# Patient Record
Sex: Female | Born: 1990 | Race: White | Hispanic: No | Marital: Single | Smoking: Never smoker
Health system: Southern US, Community
[De-identification: ages and names within clinical notes are randomized; demographics above are authoritative.]

## PROBLEM LIST (undated history)

## (undated) DIAGNOSIS — K30 Functional dyspepsia: Secondary | ICD-10-CM

## (undated) DIAGNOSIS — IMO0001 Reserved for inherently not codable concepts without codable children: Secondary | ICD-10-CM

---

## 2015-09-10 ENCOUNTER — Emergency Department (HOSPITAL_COMMUNITY): Payer: 59

## 2015-09-10 ENCOUNTER — Emergency Department (HOSPITAL_COMMUNITY)
Admission: EM | Admit: 2015-09-10 | Discharge: 2015-09-11 | Disposition: A | Discharge status: Home / Self Care | Payer: 59 | Attending: Emergency Medicine | Admitting: Emergency Medicine

## 2015-09-10 ENCOUNTER — Encounter (HOSPITAL_COMMUNITY): Payer: Self-pay | Admitting: Emergency Medicine

## 2015-09-10 DIAGNOSIS — K8020 Calculus of gallbladder without cholecystitis without obstruction: Secondary | ICD-10-CM | POA: Insufficient documentation

## 2015-09-10 DIAGNOSIS — Z3202 Encounter for pregnancy test, result negative: Secondary | ICD-10-CM | POA: Insufficient documentation

## 2015-09-10 DIAGNOSIS — Z79899 Other long term (current) drug therapy: Secondary | ICD-10-CM | POA: Diagnosis not present

## 2015-09-10 DIAGNOSIS — Z880 Allergy status to penicillin: Secondary | ICD-10-CM | POA: Diagnosis not present

## 2015-09-10 DIAGNOSIS — R1013 Epigastric pain: Secondary | ICD-10-CM | POA: Diagnosis present

## 2015-09-10 LAB — CBC
HCT: 39 % (ref 36.0–46.0)
HEMOGLOBIN: 12.3 g/dL (ref 12.0–15.0)
MCH: 26 pg (ref 26.0–34.0)
MCHC: 31.5 g/dL (ref 30.0–36.0)
MCV: 82.5 fL (ref 78.0–100.0)
PLATELETS: 330 10*3/uL (ref 150–400)
RBC: 4.73 MIL/uL (ref 3.87–5.11)
RDW: 15.8 % — AB (ref 11.5–15.5)
WBC: 11.6 10*3/uL — ABNORMAL HIGH (ref 4.0–10.5)

## 2015-09-10 LAB — URINALYSIS, ROUTINE W REFLEX MICROSCOPIC
BILIRUBIN URINE: NEGATIVE
Glucose, UA: NEGATIVE mg/dL
HGB URINE DIPSTICK: NEGATIVE
KETONES UR: NEGATIVE mg/dL
NITRITE: NEGATIVE
PROTEIN: NEGATIVE mg/dL
SPECIFIC GRAVITY, URINE: 1.02 (ref 1.005–1.030)
pH: 7.5 (ref 5.0–8.0)

## 2015-09-10 LAB — COMPREHENSIVE METABOLIC PANEL
ALBUMIN: 4.4 g/dL (ref 3.5–5.0)
ALK PHOS: 45 U/L (ref 38–126)
ALT: 15 U/L (ref 14–54)
ANION GAP: 11 (ref 5–15)
AST: 17 U/L (ref 15–41)
BILIRUBIN TOTAL: 0.5 mg/dL (ref 0.3–1.2)
BUN: 21 mg/dL — ABNORMAL HIGH (ref 6–20)
CALCIUM: 10 mg/dL (ref 8.9–10.3)
CO2: 23 mmol/L (ref 22–32)
CREATININE: 0.86 mg/dL (ref 0.44–1.00)
Chloride: 107 mmol/L (ref 101–111)
GFR calc Af Amer: >60 mL/min (ref >60)
GFR calc non Af Amer: >60 mL/min (ref >60)
GLUCOSE: 116 mg/dL — AB (ref 65–99)
Potassium: 4.1 mmol/L (ref 3.5–5.1)
Sodium: 141 mmol/L (ref 135–145)
TOTAL PROTEIN: 8 g/dL (ref 6.5–8.1)

## 2015-09-10 LAB — URINE MICROSCOPIC-ADD ON: RBC / HPF: NONE SEEN RBC/hpf (ref 0–5)

## 2015-09-10 LAB — PREGNANCY, URINE: Preg Test, Ur: NEGATIVE

## 2015-09-10 LAB — LIPASE, BLOOD: Lipase: 28 U/L (ref 11–51)

## 2015-09-10 MED ORDER — MORPHINE SULFATE (PF) 4 MG/ML IV SOLN
4.0000 mg | Freq: Once | INTRAVENOUS | Status: AC
  Administered 2015-09-11: 4 mg via INTRAVENOUS
  Filled 2015-09-10: qty 1

## 2015-09-10 MED ORDER — GI COCKTAIL ~~LOC~~
30.0000 mL | Freq: Once | ORAL | Status: AC
  Administered 2015-09-10: 30 mL via ORAL
  Filled 2015-09-10: qty 30

## 2015-09-10 MED ORDER — ONDANSETRON HCL 4 MG/2ML IJ SOLN
4.0000 mg | INTRAMUSCULAR | Status: AC
  Administered 2015-09-10: 4 mg via INTRAVENOUS
  Filled 2015-09-10: qty 2

## 2015-09-10 MED ORDER — ONDANSETRON 4 MG PO TBDP
4.0000 mg | ORAL_TABLET | Freq: Once | ORAL | Status: AC
  Administered 2015-09-10: 4 mg via ORAL
  Filled 2015-09-10: qty 1

## 2015-09-10 MED ORDER — MORPHINE SULFATE (PF) 4 MG/ML IV SOLN
4.0000 mg | Freq: Once | INTRAVENOUS | Status: AC
  Administered 2015-09-10: 4 mg via INTRAVENOUS
  Filled 2015-09-10: qty 1

## 2015-09-10 NOTE — ED Notes (Signed)
Pt requesting additional medication for pain, PA aware.

## 2015-09-10 NOTE — ED Notes (Signed)
Spoke with US tech, states that she is en route.

## 2015-09-10 NOTE — ED Notes (Signed)
Pt arrived to the Ed with a complaint of abdominal pain.  Pt's pain is located mid medial quadrant of the abdomen.  Pain is described as achy, burning and discomforting.  Pt had an episode of emesis aan hour ago.  Pt states that pain started Tuesday.

## 2015-09-10 NOTE — ED Notes (Signed)
x2 unsuccessful IV attempts. Another RN to attempt.  

## 2015-09-10 NOTE — ED Provider Notes (Signed)
CSN: [id number]     Arrival date & time 09/10/15  2108 History   First MD Initiated Contact with Patient 09/10/15 2123     Chief Complaint  Patient presents with  . Abdominal Pain    HPI  Leslie Mitchell is a 24 y.o. female with no pertinent PMH who presents to the ED with epigastric abdominal pain. She reports her symptoms started on Tuesday, and initially resolved, however recurred today prior to arrival. She states her pain is constant and characterizes her symptoms as burning. She reports sitting exacerbates her pain. She has tried pepto and [employer] seltzer for symptom relief. She reports associated nausea and one episode of emesis. She denies hematemesis. She denies fever, chills, chest pain, shortness of breath, diarrhea, constipation, hematochezia, melena, dysuria, urgency, frequency, vaginal discharge.   History reviewed. No pertinent past medical history. History reviewed. No pertinent past surgical history. History reviewed. No pertinent family history. Social History  Substance Use Topics  . Smoking status: Never Smoker   . Smokeless tobacco: Never Used  . Alcohol Use: Yes   OB History    No data available      Review of Systems  Constitutional: Negative for fever and chills.  Respiratory: Negative for shortness of breath.   Cardiovascular: Negative for chest pain.  Gastrointestinal: Positive for nausea, vomiting and abdominal pain. Negative for diarrhea, constipation and blood in stool.  Genitourinary: Negative for dysuria, urgency, frequency and vaginal discharge.  All other systems reviewed and are negative.     Allergies  Penicillins  Home Medications   Prior to Admission medications   Medication Sig Start Date End Date Taking? Authorizing Provider  fexofenadine (ALLEGRA) 30 MG tablet Take 30 mg by mouth daily.   Yes Historical Provider, MD  HYDROcodone-acetaminophen (NORCO/VICODIN) 5-325 MG tablet Take 2 tablets by mouth every 4 (four) hours as needed.  09/11/15   Mady GemmaElizabeth C Westfall, PA-C  ibuprofen (ADVIL,MOTRIN) 200 MG tablet Take 200 mg by mouth every 6 (six) hours as needed for mild pain or moderate pain.   Yes Historical Provider, MD  ondansetron (ZOFRAN ODT) 4 MG disintegrating tablet Take 1 tablet (4 mg total) by mouth every 8 (eight) hours as needed for nausea. 09/11/15   Mady GemmaElizabeth C Westfall, PA-C    BP 137/110 mmHg  Pulse 87  Temp(Src) 98.3 F (36.8 C) (Oral)  Resp 18  SpO2 98%  LMP 08/18/2015 (Approximate) Physical Exam  Constitutional: She is oriented to person, place, and time. She appears well-developed and well-nourished. No distress.  HENT:  Head: Normocephalic and atraumatic.  Right Ear: External ear normal.  Left Ear: External ear normal.  Nose: Nose normal.  Mouth/Throat: Uvula is midline, oropharynx is clear and moist and mucous membranes are normal.  Eyes: Conjunctivae, EOM and lids are normal. Pupils are equal, round, and reactive to light. Right eye exhibits no discharge. Left eye exhibits no discharge. No scleral icterus.  Neck: Normal range of motion. Neck supple.  Cardiovascular: Normal rate, regular rhythm, normal heart sounds, intact distal pulses and normal pulses.   Pulmonary/Chest: Effort normal and breath sounds normal. No respiratory distress. She has no wheezes. She has no rales.  Abdominal: Soft. Normal appearance and bowel sounds are normal. She exhibits no distension and no mass. There is no tenderness. There is no rigidity, no rebound and no guarding.  No significant TTP of abdomen, though patient reports pressure sensation with palpation.  Musculoskeletal: Normal range of motion. She exhibits no edema or tenderness.  Neurological: She  is alert and oriented to person, place, and time.  Skin: Skin is warm, dry and intact. No rash noted. She is not diaphoretic. No erythema. No pallor.  Psychiatric: She has a normal mood and affect. Her speech is normal and behavior is normal.  Nursing note and  vitals reviewed.   ED Course  Procedures (including critical care time)  Labs Review Labs Reviewed  COMPREHENSIVE METABOLIC PANEL - Abnormal; Notable for the following:    Glucose, Bld 116 (*)    BUN 21 (*)    All other components within normal limits  CBC - Abnormal; Notable for the following:    WBC 11.6 (*)    RDW 15.8 (*)    All other components within normal limits  URINALYSIS, ROUTINE W REFLEX MICROSCOPIC (NOT AT [facility]) - Abnormal; Notable for the following:    APPearance TURBID (*)    Leukocytes, UA SMALL (*)    All other components within normal limits  URINE MICROSCOPIC-ADD ON - Abnormal; Notable for the following:    Squamous Epithelial / LPF 0-5 (*)    Bacteria, UA RARE (*)    All other components within normal limits  LIPASE, BLOOD  PREGNANCY, URINE    Imaging Review US Abdomen Limited  09/11/2015  CLINICAL DATA:  Right upper quadrant pain since Tuesday. EXAM: US ABDOMEN LIMITED - RIGHT UPPER QUADRANT COMPARISON:  None. FINDINGS: Gallbladder: Stone in the dependent gallbladder with mild sludge layering. Stone measures about 8 mm maximal diameter. No gallbladder wall thickening. Murphy's sign is negative. Common bile duct: Diameter: 5.3 mm, normal Liver: No focal lesion identified. Within normal limits in parenchymal echogenicity. IMPRESSION: Cholelithiasis with a single stone and mild sludge layering. No secondary signs to suggest cholecystitis. Electronically Signed   By: Burman Nieves M.D.   On: 09/11/2015 00:18     I have personally reviewed and evaluated these images and lab results as part of my medical decision-making.   EKG Interpretation None      MDM   Final diagnoses:  Calculus of gallbladder without cholecystitis without obstruction    24 year old female presents with epigastric abdominal pain. She reports she experienced the same symptoms on Tuesday. She states her pain initially resolved, however occurred prior to arrival today. She reports  associated nausea and one episode of emesis. She denies fever, chills, chest pain, shortness of breath, hematemesis, diarrhea, constipation, hematochezia, melena, dysuria, urgency, frequency, vaginal discharge.  Patient is afebrile. Vital signs stable. Heart regular rate and rhythm. Lungs clear to auscultation bilaterally. Abdomen soft, nontender, nondistended. Patient describes pressure sensation on palpation of upper abdominal quadrants, however denies pain.  Given zofran for nausea and GI cocktail for pain. Labs as above pending. Patient denies pain improvement, given pain medication.  CBC remarkable for mild leukocytosis of 11.6. CMP unremarkable. Lipase within normal limits. UA negative for infection. Urine pregnancy negative.  Will obtain RUQ [place]. Imaging remarkable for cholelithiasis with a single stone and mild sludge layering, no secondary signs to suggest cholecystitis. No gallbladder wall thickening. Murphy's sign negative.  Patient is nontoxic, feel she is stable for discharge at this time. Will discharge with short course of pain medication and antiemetic for symptom management. Patient to follow-up with general surgery for further evaluation and management. Return precautions discussed. Patient verbalizes her understanding and is in agreement with plan.  BP 137/110 mmHg  Pulse 87  Temp(Src) 98.3 F (36.8 C) (Oral)  Resp 18  SpO2 98%  LMP 08/18/2015 (Approximate)   Mady Gemma,  PA-C 09/11/15 1610  Lavera Guise, MD 09/11/15 1434

## 2015-09-10 NOTE — ED Notes (Signed)
Pt actively vomiting.  PA aware.

## 2015-09-11 DIAGNOSIS — K8020 Calculus of gallbladder without cholecystitis without obstruction: Secondary | ICD-10-CM | POA: Diagnosis not present

## 2015-09-11 MED ORDER — HYDROCODONE-ACETAMINOPHEN 5-325 MG PO TABS: 2.0000 | ORAL_TABLET | ORAL | Status: DC | PRN

## 2015-09-11 MED ORDER — ONDANSETRON 4 MG PO TBDP: 4.0000 mg | ORAL_TABLET | Freq: Three times a day (TID) | ORAL | Status: DC | PRN

## 2015-09-11 NOTE — ED Notes (Signed)
Pt able to ambulate to BR at this time with stand by assist only.

## 2015-09-11 NOTE — Discharge Instructions (Signed)
1. Medications: pain medication, zofran, usual home medications 2. Treatment: rest, drink plenty of fluids 3. Follow Up: please followup with general surgery for discussion of your diagnoses and further evaluation after today's visit; please return to the ER for high fever, severe pain, persistent vomiting, new or worsening symptoms   Biliary Colic Biliary colic is a pain in the upper abdomen. The pain:  Is usually felt on the right side of the abdomen, but it may also be felt in the center of the abdomen, just below the breastbone (sternum).  May spread back toward the right shoulder blade.  May be steady or irregular.  May be accompanied by nausea and vomiting. Most of the time, the pain goes away in 1-5 hours. After the most intense pain passes, the abdomen may continue to ache mildly for about 24 hours. Biliary colic is caused by a blockage in the bile duct. The bile duct is a pathway that carries bile--a liquid that helps to digest fats--from the gallbladder to the small intestine. Biliary colic usually occurs after eating, when the digestive system demands bile. The pain develops when muscle cells contract forcefully to try to move the blockage so that bile can get by. HOME CARE INSTRUCTIONS  Take medicines only as directed by your health care provider.  Drink enough fluid to keep your urine clear or pale yellow.  Avoid fatty, greasy, and fried foods. These kinds of foods increase your body's demand for bile.  Avoid any foods that make your pain worse.  Avoid overeating.  Avoid having a large meal after fasting. SEEK MEDICAL CARE IF:  You develop a fever.  Your pain gets worse.  You vomit.  You develop nausea that prevents you from eating and drinking. SEEK IMMEDIATE MEDICAL CARE IF:  You suddenly develop a fever and shaking chills.  You develop a yellowish discoloration (jaundice) of:  Skin.  Whites of the eyes.  Mucous membranes.  You have continuous or  severe pain that is not relieved with medicines.  You have nausea and vomiting that is not relieved with medicines.  You develop dizziness or you faint.   This information is not intended to replace advice given to you by your health care provider. Make sure you discuss any questions you have with your health care provider.   Document Released: 03/06/2006 Document Revised: 02/17/2015 Document Reviewed: 07/15/2014 Elsevier Interactive Patient Education 2016 [employer].  Cholelithiasis Cholelithiasis (also called gallstones) is a form of gallbladder disease in which gallstones form in your gallbladder. The gallbladder is an organ that stores bile made in the liver, which helps digest fats. Gallstones begin as small crystals and slowly grow into stones. Gallstone pain occurs when the gallbladder spasms and a gallstone is blocking the duct. Pain can also occur when a stone passes out of the duct.  RISK FACTORS  Being female.   Having multiple pregnancies. Health care providers sometimes advise removing diseased gallbladders before future pregnancies.   Being obese.  Eating a diet heavy in fried foods and fat.   Being older than 60 years and increasing age.   Prolonged use of medicines containing female hormones.   Having diabetes mellitus.   Rapidly losing weight.   Having a family history of gallstones (heredity).  SYMPTOMS  Nausea.   Vomiting.  Abdominal pain.   Yellowing of the skin (jaundice).   Sudden pain. It may persist from several minutes to several hours.  Fever.   Tenderness to the touch. In some cases, when  gallstones do not move into the bile duct, people have no pain or symptoms. These are called "silent" gallstones.  TREATMENT Silent gallstones do not need treatment. In severe cases, emergency surgery may be required. Options for treatment include:  Surgery to remove the gallbladder. This is the most common treatment.  Medicines. These  do not always work and may take 6-12 months or more to work.  Shock wave treatment (extracorporeal biliary lithotripsy). In this treatment an ultrasound machine sends shock waves to the gallbladder to break gallstones into smaller pieces that can pass into the intestines or be dissolved by medicine. HOME CARE INSTRUCTIONS   Only take over-the-counter or prescription medicines for pain, discomfort, or fever as directed by your health care provider.   Follow a low-fat diet until seen again by your health care provider. Fat causes the gallbladder to contract, which can result in pain.   Follow up with your health care provider as directed. Attacks are almost always recurrent and surgery is usually required for permanent treatment.  SEEK IMMEDIATE MEDICAL CARE IF:   Your pain increases and is not controlled by medicines.   You have a fever or persistent symptoms for more than 2-3 days.   You have a fever and your symptoms suddenly get worse.   You have persistent nausea and vomiting.  MAKE SURE YOU:   Understand these instructions.  Will watch your condition.  Will get help right away if you are not doing well or get worse.   This information is not intended to replace advice given to you by your health care provider. Make sure you discuss any questions you have with your health care provider.   Document Released: 09/29/2005 Document Revised: 06/05/2013 Document Reviewed: 03/27/2013 Elsevier Interactive Patient Education [employer]2016 [employer]

## 2015-09-15 ENCOUNTER — Other Ambulatory Visit: Payer: Self-pay | Admitting: Surgery

## 2015-09-15 NOTE — H&P (Signed)
Leslie Mitchell 09/15/2015 11:17 AM Location: [facility] Patient #: [id number] DOB: [birth year] Single / Language: Leslie Mitchell / Race: White Female  History of Present Illness Leslie Hector MD; 09/15/2015 11:46 AM) The patient is a 24 year old female who presents for evaluation of gall stones. Patient sent from the emergency room Leslie Chimes, PA-C & Leslie Mitchell, [place] ED) after visit on Thanksgiving 11/24. Concern for symptomatic gallstones.  Pleasant morbidly obese female with attacks of epigastric abdominal pain radiating to RIGHT side. Happen last Tuesday, 6 days ago, after eating lunch. Then happened again on Thanksgiving. She is morbidly obese. She has intentionally lost about 90 pounds over the past year with a more strict dieting regimen. She does confess that around Thanksgiving she underwent off the wagon. The second attack did she try and use milked calm things down upon the recommendation of a friend that is a nurse who thought maybe it was reflux. That backfired and she had severe vomiting, thus triggering going to the emergency room. Workup ruled out other etiologies. Ultrasound did show gallbladder with sludge and an 8 mm solitary stone. She felt better with pain and nausea medicines. No and has been sick around her. No recent travel history. No history of Crohn's or irritable bowel. No inflammatory bowel disease. No family history of data tract disorders. She rarely drinks alcohol. No history of hepatitis or pancreatitis. She had gotten no relief on these attacks with antacid medications. She denies much in way of heartburn or reflux.   She normally can walk an hour a day without much difficulty. No prior abdominal surgeries. Again weight was 391. Now around 300.        Results for Leslie Mitchell (MRN [id number]) as of 09/15/2015 11:25 Ref. Range 09/10/2015 21:31 09/10/2015 21:36 Sodium Latest Ref Range: 135-145 mmol/L 141 Potassium  Latest Ref Range: 3.5-5.1 mmol/L 4.1 Chloride Latest Ref Range: 101-111 mmol/L 107 CO2 Latest Ref Range: 22-32 mmol/L 23 BUN Latest Ref Range: 6-20 mg/dL 21 (H) Creatinine Latest Ref Range: 0.44-1.00 mg/dL 0.86 Calcium Latest Ref Range: 8.9-10.3 mg/dL 10.0 EGFR (Non-African Amer.) Latest Ref Range: >60 mL/min >60 EGFR (African American) Latest Ref Range: >60 mL/min >60 Glucose Latest Ref Range: 65-99 mg/dL 116 (H) Anion gap Latest Ref Range: 5-15 11 Alkaline Phosphatase Latest Ref Range: 38-126 U/L 45 Albumin Latest Ref Range: 3.5-5.0 g/dL 4.4 Lipase Latest Ref Range: 11-51 U/L 28 AST Latest Ref Range: 15-41 U/L 17 ALT Latest Ref Range: 14-54 U/L 15 Total Protein Latest Ref Range: 6.5-8.1 g/dL 8.0 Total Bilirubin Latest Ref Range: 0.3-1.2 mg/dL 0.5 WBC Latest Ref Range: 4.0-10.5 K/uL 11.6 (H) RBC Latest Ref Range: 3.87-5.11 MIL/uL 4.73 Hemoglobin Latest Ref Range: 12.0-15.0 g/dL 12.3 HCT Latest Ref Range: 36.0-46.0 % 39.0 MCV Latest Ref Range: 78.0-100.0 fL 82.5 MCH Latest Ref Range: 26.0-34.0 pg 26.0 MCHC Latest Ref Range: 30.0-36.0 g/dL 31.5 RDW Latest Ref Range: 11.5-15.5 % 15.8 (H) Platelets Latest Ref Range: 150-400 K/uL 330 Preg Test, Ur Latest Ref Range: NEGATIVE NEGATIVE Appearance Latest Ref Range: CLEAR TURBID (A) Bacteria, UA Latest Ref Range: NONE SEEN RARE (A) Bilirubin Urine Latest Ref Range: NEGATIVE NEGATIVE Color, Urine Latest Ref Range: YELLOW YELLOW Glucose Latest Ref Range: NEGATIVE mg/dL NEGATIVE Hgb urine dipstick Latest Ref Range: NEGATIVE NEGATIVE Ketones, ur Latest Ref Range: NEGATIVE mg/dL NEGATIVE Leukocytes, UA Latest Ref Range: NEGATIVE SMALL (A) Nitrite Latest Ref Range: NEGATIVE NEGATIVE pH Latest Ref Range: 5.0-8.0 7.5 Protein Latest Ref Range: NEGATIVE mg/dL NEGATIVE RBC /  HPF Latest Ref Range: 0-5 RBC/hpf NONE SEEN Specific Gravity, Urine Latest Ref Range: 1.005-1.030 1.020 Squamous Epithelial / LPF Latest Ref Range: NONE SEEN 0-5  (A) Urine-Other Unknown AMORPHOUS URATES/... WBC, UA Latest Ref Range: 0-5 WBC/hpf 0-5      CLINICAL DATA: Right upper quadrant pain since Tuesday.  EXAM: US ABDOMEN LIMITED - RIGHT UPPER QUADRANT  COMPARISON: None.  FINDINGS: Gallbladder:  Stone in the dependent gallbladder with mild sludge layering. Stone measures about 8 mm maximal diameter. No gallbladder wall thickening. Murphy's sign is negative.  Common bile duct:  Diameter: 5.3 mm, normal  Liver:  No focal lesion identified. Within normal limits in parenchymal echogenicity.  IMPRESSION: Cholelithiasis with a single stone and mild sludge layering. No secondary signs to suggest cholecystitis.   Electronically Signed By: Leslie Mitchell M.D. On: 09/11/2015 00:18      Problem List/Past Medical Leslie Hector, MD; 09/15/2015 11:48 AM) MORBID OBESITY, UNSPECIFIED OBESITY TYPE (E66.01)  Other Problems Leslie Hector, MD; 09/15/2015 11:48 AM) Anxiety Disorder Asthma Back Pain Cholelithiasis Gastroesophageal Reflux Disease High blood pressure  Past Surgical History Leslie Mitchell, CMA; 09/15/2015 11:17 AM) No pertinent past surgical history  Diagnostic Studies History Leslie Mitchell, CMA; 09/15/2015 11:17 AM) Colonoscopy never Mammogram never Pap Smear >5 years ago  Allergies Leslie Mitchell, CMA; 09/15/2015 11:18 AM) Penicillin VK *PENICILLINS* Hives.  Medication History Leslie Mitchell, CMA; 09/15/2015 11:18 AM) Ibuprofen (800MG Tablet, Oral) Active. Ondansetron (4MG Tablet Disperse, Oral) Active. Allegra (30MG Tablet, Oral) Active. Medications Reconciled  Pregnancy / Birth History Leslie Mitchell, CMA; 09/15/2015 11:17 AM) Age at menarche 42 years. Gravida 0 Para 0 Regular periods     Review of Systems Leslie Mitchell CMA; 09/15/2015 11:17 AM) General Present- Fatigue. Not Present- Appetite Loss, Chills, Fever, Night Sweats, Weight Gain and Weight Loss. Skin Present-  Ulcer. Not Present- Change in Wart/Mole, Dryness, Hives, Jaundice, New Lesions, Non-Healing Wounds and Rash. HEENT Present- Seasonal Allergies and Wears glasses/contact lenses. Not Present- Earache, Hearing Loss, Hoarseness, Nose Bleed, Oral Ulcers, Ringing in the Ears, Sinus Pain, Sore Throat, Visual Disturbances and Yellow Eyes. Respiratory Present- Snoring. Not Present- Bloody sputum, Chronic Cough, Difficulty Breathing and Wheezing. Gastrointestinal Present- Change in Bowel Habits. Not Present- Abdominal Pain, Bloating, Bloody Stool, Chronic diarrhea, Constipation, Difficulty Swallowing, Excessive gas, Gets full quickly at meals, Hemorrhoids, Indigestion, Nausea, Rectal Pain and Vomiting. Psychiatric Present- Anxiety and Frequent crying. Not Present- Bipolar, Change in Sleep Pattern, Depression and Fearful.  Vitals Leslie Mitchell CMA; 09/15/2015 11:18 AM) 09/15/2015 11:18 AM Weight: 306.6 lb Height: 63in Body Surface Area: 2.32 m Body Mass Index: 54.31 kg/m  Temp.: 13F(Temporal)  Pulse: 84 (Regular)  BP: 120/82 (Sitting, Left Arm, Standard)      Physical Exam Leslie Hector MD; 09/15/2015 11:47 AM)  General Mental Status-Alert. General Appearance-Not in acute distress, Not Sickly. Orientation-Oriented X3. Hydration-Well hydrated. Voice-Normal.  Integumentary Global Assessment Upon inspection and palpation of skin surfaces of the - Axillae: non-tender, no inflammation or ulceration, no drainage. and Distribution of scalp and body hair is normal. General Characteristics Temperature - normal warmth is noted.  Head and Neck Head-normocephalic, atraumatic with no lesions or palpable masses. Face Global Assessment - atraumatic, no absence of expression. Neck Global Assessment - no abnormal movements, no bruit auscultated on the right, no bruit auscultated on the left, no decreased range of motion, non-tender. Trachea-midline. Thyroid Gland  Characteristics - non-tender.  Eye Eyeball - Left-Extraocular movements intact, No Nystagmus. Eyeball - Right-Extraocular movements intact, No Nystagmus. Cornea -  Left-No Hazy. Cornea - Right-No Hazy. Sclera/Conjunctiva - Left-No scleral icterus, No Discharge. Sclera/Conjunctiva - Right-No scleral icterus, No Discharge. Pupil - Left-Direct reaction to light normal. Pupil - Right-Direct reaction to light normal.  ENMT Ears Pinna - Left - no drainage observed, no generalized tenderness observed. Right - no drainage observed, no generalized tenderness observed. Nose and Sinuses External Inspection of the Nose - no destructive lesion observed. Inspection of the nares - Left - quiet respiration. Right - quiet respiration. Mouth and Throat Lips - Upper Lip - no fissures observed, no pallor noted. Lower Lip - no fissures observed, no pallor noted. Nasopharynx - no discharge present. Oral Cavity/Oropharynx - Tongue - no dryness observed. Oral Mucosa - no cyanosis observed. Hypopharynx - no evidence of airway distress observed.  Chest and Lung Exam Inspection Movements - Normal and Symmetrical. Accessory muscles - No use of accessory muscles in breathing. Palpation Palpation of the chest reveals - Non-tender. Auscultation Breath sounds - Normal and Clear.  Cardiovascular Auscultation Rhythm - Regular. Murmurs & Other Heart Sounds - Auscultation of the heart reveals - No Murmurs and No Systolic Clicks.  Abdomen Inspection Inspection of the abdomen reveals - No Visible peristalsis and No Abnormal pulsations. Umbilicus - No Bleeding, No Urine drainage. Palpation/Percussion Palpation and Percussion of the abdomen reveal - Soft, Non Tender, No Rebound tenderness, No Rigidity (guarding) and No Cutaneous hyperesthesia. Note: Morbid obese but soft. Mild epigastric and RIGHT upper discomfort but no Murphy sign. Moderate sized panniculus with some mild maceration but hygiene  otherwise good.  Female Genitourinary Sexual Maturity Tanner 5 - Adult hair pattern. Note: No vaginal bleeding nor discharge. No inguinal hernias. No rash under panniculus. She claims she has a history of hydradenitis. I did not see any significant inflammation site from some mild skin maceration  Peripheral Vascular Upper Extremity Inspection - Left - No Cyanotic nailbeds, Not Ischemic. Right - No Cyanotic nailbeds, Not Ischemic.  Neurologic Neurologic evaluation reveals -normal attention span and ability to concentrate, able to name objects and repeat phrases. Appropriate fund of knowledge , normal sensation and normal coordination. Mental Status Affect - not angry, not paranoid. Cranial Nerves-Normal Bilaterally. Gait-Normal.  Neuropsychiatric Mental status exam performed with findings of-able to articulate well with normal speech/language, rate, volume and coherence, thought content normal with ability to perform basic computations and apply abstract reasoning and no evidence of hallucinations, delusions, obsessions or homicidal/suicidal ideation.  Musculoskeletal Global Assessment Spine, Ribs and Pelvis - no instability, subluxation or laxity. Right Upper Extremity - no instability, subluxation or laxity.  Lymphatic Head & Neck  General Head & Neck Lymphatics: Bilateral - Description - No Localized lymphadenopathy. Axillary  General Axillary Region: Bilateral - Description - No Localized lymphadenopathy. Femoral & Inguinal  Generalized Femoral & Inguinal Lymphatics: Left - Description - No Localized lymphadenopathy. Right - Description - No Localized lymphadenopathy.    Assessment & Plan Leslie Hector MD; 09/15/2015 11:44 AM)  CHRONIC CHOLECYSTITIS WITH CALCULUS (K80.10) Impression: Classic story of biliary colic with severe attack most recently in Thanksgiving. Rest of the differential diagnosis seems unlikely.  I think she would benefit from  cholecystectomy. Especially in light of the intense attacks and young age, risk of further problems is very high. She probably cannot wait a few months. She was concerned about the finances so we'll have our scheduler work with her to make sure we can do it safely and yet not overly strain her financially.  Current Plans You are being scheduled for surgery -  Our schedulers will call you.  You should hear from our office's scheduling department within 5 working days about the location, date, and time of surgery. We try to make accommodations for patient's preferences in scheduling surgery, but sometimes the OR schedule or the surgeon's schedule prevents [place] from making those accommodations.  If you have not heard from our office [phone]) in 5 working days, call the office and ask for your surgeon's nurse.  If you have other questions about your diagnosis, plan, or surgery, call the office and ask for your surgeon's nurse.  Pt Education - Pamphlet Given - Laparoscopic Gallbladder Surgery: discussed with patient and provided information. Written instructions provided The anatomy & physiology of hepatobiliary & pancreatic function was discussed. The pathophysiology of gallbladder dysfunction was discussed. Natural history risks without surgery was discussed. I feel the risks of no intervention will lead to serious problems that outweigh the operative risks; therefore, I recommended cholecystectomy to remove the pathology. I explained laparoscopic techniques with possible need for an open approach. Probable cholangiogram to evaluate the bilary tract was explained as well.  Risks such as bleeding, infection, abscess, leak, injury to other organs, need for further treatment, heart attack, death, and other risks were discussed. I noted a good likelihood this will help address the problem. Possibility that this will not correct all abdominal symptoms was explained. Goals of post-operative recovery were  discussed as well. We will work to minimize complications. An educational handout further explaining the pathology and treatment options was given as well. Questions were answered. The patient expresses understanding & wishes to proceed with surgery.  Pt Education - [facility] Laparosopic Post Op HCI (Zalyn Amend) Pt Education - [facility] Good Bowel Health ([facility]) Pt Education - Laparoscopic Cholecystectomy: gallbladder  Leslie Mitchell, M.D., F.A.C.S. Gastrointestinal and Minimally Invasive Surgery [facility] [address], [place], [place] [address] [phone] Main / Paging

## 2015-10-07 ENCOUNTER — Encounter (HOSPITAL_COMMUNITY): Payer: Self-pay

## 2015-10-07 ENCOUNTER — Encounter (HOSPITAL_COMMUNITY): Payer: Self-pay | Admitting: Emergency Medicine

## 2015-10-07 ENCOUNTER — Emergency Department (HOSPITAL_COMMUNITY)
Admission: EM | Admit: 2015-10-07 | Discharge: 2015-10-07 | Disposition: A | Discharge status: Home / Self Care | Payer: 59 | Attending: Emergency Medicine | Admitting: Emergency Medicine

## 2015-10-07 ENCOUNTER — Encounter (HOSPITAL_COMMUNITY)
Admission: RE | Admit: 2015-10-07 | Discharge: 2015-10-07 | Disposition: A | Discharge status: Home / Self Care | Payer: 59 | Source: Ambulatory Visit | Attending: Surgery | Admitting: Surgery

## 2015-10-07 DIAGNOSIS — Z793 Long term (current) use of hormonal contraceptives: Secondary | ICD-10-CM | POA: Diagnosis not present

## 2015-10-07 DIAGNOSIS — K8050 Calculus of bile duct without cholangitis or cholecystitis without obstruction: Secondary | ICD-10-CM

## 2015-10-07 DIAGNOSIS — K219 Gastro-esophageal reflux disease without esophagitis: Secondary | ICD-10-CM | POA: Diagnosis not present

## 2015-10-07 DIAGNOSIS — K8010 Calculus of gallbladder with chronic cholecystitis without obstruction: Secondary | ICD-10-CM | POA: Diagnosis present

## 2015-10-07 DIAGNOSIS — Z880 Allergy status to penicillin: Secondary | ICD-10-CM | POA: Insufficient documentation

## 2015-10-07 DIAGNOSIS — Z6841 Body Mass Index (BMI) 40.0 and over, adult: Secondary | ICD-10-CM | POA: Diagnosis not present

## 2015-10-07 DIAGNOSIS — J45909 Unspecified asthma, uncomplicated: Secondary | ICD-10-CM | POA: Diagnosis not present

## 2015-10-07 DIAGNOSIS — K7581 Nonalcoholic steatohepatitis (NASH): Secondary | ICD-10-CM | POA: Diagnosis not present

## 2015-10-07 DIAGNOSIS — R1011 Right upper quadrant pain: Secondary | ICD-10-CM | POA: Diagnosis present

## 2015-10-07 HISTORY — DX: Reserved for inherently not codable concepts without codable children: IMO0001

## 2015-10-07 HISTORY — DX: Functional dyspepsia: K30

## 2015-10-07 LAB — CBC
HCT: 37.7 % (ref 36.0–46.0)
Hemoglobin: 11.6 g/dL — ABNORMAL LOW (ref 12.0–15.0)
MCH: 25.9 pg — AB (ref 26.0–34.0)
MCHC: 30.8 g/dL (ref 30.0–36.0)
MCV: 84.2 fL (ref 78.0–100.0)
PLATELETS: 272 10*3/uL (ref 150–400)
RBC: 4.48 MIL/uL (ref 3.87–5.11)
RDW: 16.4 % — AB (ref 11.5–15.5)
WBC: 8.4 10*3/uL (ref 4.0–10.5)

## 2015-10-07 LAB — URINALYSIS, ROUTINE W REFLEX MICROSCOPIC
BILIRUBIN URINE: NEGATIVE
GLUCOSE, UA: NEGATIVE mg/dL
HGB URINE DIPSTICK: NEGATIVE
Ketones, ur: NEGATIVE mg/dL
Leukocytes, UA: NEGATIVE
Nitrite: NEGATIVE
PROTEIN: NEGATIVE mg/dL
Specific Gravity, Urine: 1.022 (ref 1.005–1.030)
pH: 5.5 (ref 5.0–8.0)

## 2015-10-07 LAB — LIPASE, BLOOD: Lipase: 28 U/L (ref 11–51)

## 2015-10-07 LAB — COMPREHENSIVE METABOLIC PANEL
ALK PHOS: 52 U/L (ref 38–126)
ALT: 31 U/L (ref 14–54)
AST: 19 U/L (ref 15–41)
Albumin: 4.2 g/dL (ref 3.5–5.0)
Anion gap: 8 (ref 5–15)
BUN: 13 mg/dL (ref 6–20)
CALCIUM: 9.3 mg/dL (ref 8.9–10.3)
CO2: 25 mmol/L (ref 22–32)
CREATININE: 0.76 mg/dL (ref 0.44–1.00)
Chloride: 106 mmol/L (ref 101–111)
GFR calc non Af Amer: >60 mL/min (ref >60)
GLUCOSE: 103 mg/dL — AB (ref 65–99)
Potassium: 4.1 mmol/L (ref 3.5–5.1)
SODIUM: 139 mmol/L (ref 135–145)
Total Bilirubin: 0.5 mg/dL (ref 0.3–1.2)
Total Protein: 7.8 g/dL (ref 6.5–8.1)

## 2015-10-07 LAB — PREGNANCY, URINE: PREG TEST UR: NEGATIVE

## 2015-10-07 MED ORDER — PERCOCET 5-325 MG PO TABS: 1.0000 | ORAL_TABLET | Freq: Four times a day (QID) | ORAL | Status: DC | PRN

## 2015-10-07 MED ORDER — PROMETHAZINE HCL 25 MG/ML IJ SOLN
25.0000 mg | Freq: Once | INTRAMUSCULAR | Status: AC
  Administered 2015-10-07: 25 mg via INTRAVENOUS
  Filled 2015-10-07: qty 1

## 2015-10-07 MED ORDER — GENTAMICIN SULFATE 40 MG/ML IJ SOLN
5.0000 mg/kg | INTRAVENOUS | Status: AC
  Administered 2015-10-08: 440 mg via INTRAVENOUS
  Filled 2015-10-07 (×2): qty 11

## 2015-10-07 MED ORDER — HYDROMORPHONE HCL 1 MG/ML IJ SOLN
1.0000 mg | Freq: Once | INTRAMUSCULAR | Status: AC
  Administered 2015-10-07: 1 mg via INTRAVENOUS
  Filled 2015-10-07: qty 1

## 2015-10-07 MED ORDER — SODIUM CHLORIDE 0.9 % IV BOLUS (SEPSIS)
1000.0000 mL | Freq: Once | INTRAVENOUS | Status: AC
  Administered 2015-10-07: 1000 mL via INTRAVENOUS

## 2015-10-07 NOTE — Discharge Instructions (Signed)
Return here as needed.  Follow-up as scheduled for your surgery

## 2015-10-07 NOTE — Progress Notes (Signed)
CBC, CMP, urinalysis and lipase results per epic 10/07/2015

## 2015-10-07 NOTE — ED Notes (Signed)
Unsuccessful attempt at getting blood. Informed the nurse.

## 2015-10-07 NOTE — ED Notes (Signed)
Patient presents for RUQ abdominal pain since 2200 yesterday evening. History of gallstones and states this feels similar, also c/o nausea. Denies vomiting. No relief with home pain meds.

## 2015-10-07 NOTE — ED Notes (Signed)
Attempted blood draw but was unsuccessful 

## 2015-10-07 NOTE — Patient Instructions (Signed)
Leslie Mitchell  10/07/2015   Your procedure is scheduled on: Thursday October 08, 2015   [facility]  [place]vators to 3rd floor to  Short Stay Center at 5:00 AM.  Call this number if you have problems the morning of surgery [phone]   Remember: ONLY 1 PERSON MAY GO WITH YOU TO SHORT STAY TO GET  READY MORNING OF YOUR SURGERY.  Do not eat food or drink liquids :After Midnight.     Take these medicines the morning of surgery with A SIP OF WATER: Hydrocodone - Acetaminophen if needed or Percocet if needed (pt currently has not had prescription filled; Do not take both - may take one or the other                                You may not have any metal on your body including hair pins and              piercings  Do not wear jewelry, make-up, lotions, powders or perfumes, deodorant             Do not wear nail polish.  Do not shave  48 hours prior to surgery.                Do not bring valuables to the hospital. [facility] IS NOT             RESPONSIBLE   FOR VALUABLES.  Contacts, dentures or bridgework may not be worn into surgery.     Patients discharged the day of surgery will not be allowed to drive home.  Name and phone number of your driver:Leslie Mitchell (father) or Leslie Katayamali Mitchell (boyfriend)  _____________________________________________________________________             [facility] - Preparing for Surgery Before surgery, you can play an important role.  Because skin is not sterile, your skin needs to be as free of germs as possible.  You can reduce the number of germs on your skin by washing with CHG (chlorahexidine gluconate) soap before surgery.  CHG is an antiseptic cleaner which kills germs and bonds with the skin to continue killing germs even after washing. Please DO NOT use if you have an allergy to CHG or antibacterial soaps.  If your skin becomes reddened/irritated stop using the CHG and inform your nurse  when you arrive at Short Stay. Do not shave (including legs and underarms) for at least 48 hours prior to the first CHG shower.  You may shave your face/neck. Please follow these instructions carefully:  1.  Shower with CHG Soap the night before surgery and the  morning of Surgery.  2.  If you choose to wash your hair, wash your hair first as usual with your  normal  shampoo.  3.  After you shampoo, rinse your hair and body thoroughly to remove the  shampoo.                           4.  Use CHG as you would any other liquid soap.  You can apply chg directly  to the skin and wash                       Gently  with a scrungie or clean washcloth.  5.  Apply the CHG Soap to your body ONLY FROM THE NECK DOWN.   Do not use on face/ open                           Wound or open sores. Avoid contact with eyes, ears mouth and genitals (private parts).                       Wash face,  Genitals (private parts) with your normal soap.             6.  Wash thoroughly, paying special attention to the area where your surgery  will be performed.  7.  Thoroughly rinse your body with warm water from the neck down.  8.  DO NOT shower/wash with your normal soap after using and rinsing off  the CHG Soap.                9.  Pat yourself dry with a clean towel.            10.  Wear clean pajamas.            11.  Place clean sheets on your bed the night of your first shower and do not  sleep with pets. Day of Surgery : Do not apply any lotions/deodorants the morning of surgery.  Please wear clean clothes to the hospital/surgery center.  FAILURE TO FOLLOW THESE INSTRUCTIONS MAY RESULT IN THE CANCELLATION OF YOUR SURGERY PATIENT SIGNATURE_________________________________  NURSE SIGNATURE__________________________________  ________________________________________________________________________

## 2015-10-07 NOTE — ED Provider Notes (Signed)
CSN: [id number]     Arrival date & time 10/07/15  0035 History   First MD Initiated Contact with Patient 10/07/15 0057     No chief complaint on file.    (Consider location/radiation/quality/duration/timing/severity/associated sxs/prior Treatment) HPI Patient presents to the emergency department with right upper quadrant abdominal pain that started yesterday evening around 10 PM.  Patient states that she is due to have her gallbladder removed on Thursday morning.  Patient states that she has also been nauseated but no vomiting.  The patient denies chest pain, shortness of breath, weakness, dizziness, headache, blurred vision, back pain, dysuria, incontinence, bloody stool, hematemesis, or syncope.  The patient states that nothing seemed to make her condition better.  Palpation makes the pain worse.  The patient did not take any medications prior to arrival History reviewed. No pertinent past medical history. History reviewed. No pertinent past surgical history. No family history on file. Social History  Substance Use Topics  . Smoking status: Never Smoker   . Smokeless tobacco: Never Used  . Alcohol Use: Yes   OB History    No data available     Review of Systems  All other systems negative except as documented in the HPI. All pertinent positives and negatives as reviewed in the HPI.  Allergies  Penicillins  Home Medications   Prior to Admission medications   Medication Sig Start Date End Date Taking? Authorizing Provider  fexofenadine (ALLEGRA) 30 MG tablet Take 180 mg by mouth daily as needed (allergies).    Yes Historical Provider, MD  HYDROcodone-acetaminophen (NORCO/VICODIN) 5-325 MG tablet Take 2 tablets by mouth every 6 (six) hours as needed for moderate pain.   Yes Historical Provider, MD  ibuprofen (ADVIL,MOTRIN) 200 MG tablet Take 800 mg by mouth 2 (two) times daily as needed for headache or cramping.    Yes Historical Provider, MD  norgestimate-ethinyl estradiol  (ORTHO-CYCLEN,SPRINTEC,PREVIFEM) 0.25-35 MG-MCG tablet Take 1 tablet by mouth daily.   Yes Historical Provider, MD   BP 105/64 mmHg  Pulse 57  Temp(Src) 98.3 F (36.8 C) (Oral)  Resp 16  Ht  (1.6 m)  Wt 140.388 kg  BMI 54.84 kg/m2  SpO2 99%  LMP 09/26/2015 Physical Exam  Constitutional: She is oriented to person, place, and time. She appears well-developed and well-nourished. No distress.  HENT:  Head: Normocephalic and atraumatic.  Mouth/Throat: Oropharynx is clear and moist.  Eyes: Pupils are equal, round, and reactive to light.  Neck: Normal range of motion. Neck supple.  Cardiovascular: Normal rate, regular rhythm and normal heart sounds.  Exam reveals no gallop and no friction rub.   No murmur heard. Pulmonary/Chest: Effort normal and breath sounds normal. No respiratory distress. She has no wheezes.  Abdominal: Soft. Normal appearance and bowel sounds are normal. She exhibits no distension. There is tenderness in the right upper quadrant. There is no rigidity, no rebound and no guarding.  Neurological: She is alert and oriented to person, place, and time. She exhibits normal muscle tone. Coordination normal.  Skin: Skin is warm and dry. No rash noted. No erythema.  Psychiatric: She has a normal mood and affect. Her behavior is normal.  Nursing note and vitals reviewed.   ED Course  Procedures (including critical care time) Labs Review Labs Reviewed  COMPREHENSIVE METABOLIC PANEL - Abnormal; Notable for the following:    Glucose, Bld 103 (*)    All other components within normal limits  CBC - Abnormal; Notable for the following:    Hemoglobin  11.6 (*)    MCH 25.9 (*)    RDW 16.4 (*)    All other components within normal limits  URINALYSIS, ROUTINE W REFLEX MICROSCOPIC (NOT AT [facility]) - Abnormal; Notable for the following:    APPearance CLOUDY (*)    All other components within normal limits  LIPASE, BLOOD    Imaging Review No results found. I have personally  reviewed and evaluated these images and lab results as part of my medical decision-making.  Patient is feeling better at this time.  She will be discharged home, told to return here as needed.  Patient agrees the plan and all questions were answered.  Patient's blood test were reviewed and did not show any significant abnormality   Charlestine NightChristopher Dovid Bartko, PA-C 10/07/15 0354  Devoria AlbeIva Knapp, MD 10/07/15 [phone]

## 2015-10-08 ENCOUNTER — Ambulatory Visit (HOSPITAL_COMMUNITY)
Admission: RE | Admit: 2015-10-08 | Discharge: 2015-10-08 | Disposition: A | Discharge status: Home / Self Care | Payer: 59 | Source: Ambulatory Visit | Attending: Surgery | Admitting: Surgery

## 2015-10-08 ENCOUNTER — Ambulatory Visit (HOSPITAL_COMMUNITY): Payer: 59 | Admitting: Certified Registered"

## 2015-10-08 ENCOUNTER — Encounter (HOSPITAL_COMMUNITY): Payer: Self-pay | Admitting: *Deleted

## 2015-10-08 ENCOUNTER — Ambulatory Visit (HOSPITAL_COMMUNITY): Payer: 59

## 2015-10-08 ENCOUNTER — Encounter (HOSPITAL_COMMUNITY)
Admission: RE | Disposition: A | Discharge status: Home / Self Care | Payer: Self-pay | Source: Ambulatory Visit | Attending: Surgery

## 2015-10-08 DIAGNOSIS — E669 Obesity, unspecified: Secondary | ICD-10-CM | POA: Diagnosis present

## 2015-10-08 DIAGNOSIS — F419 Anxiety disorder, unspecified: Secondary | ICD-10-CM | POA: Diagnosis present

## 2015-10-08 DIAGNOSIS — K760 Fatty (change of) liver, not elsewhere classified: Secondary | ICD-10-CM | POA: Diagnosis present

## 2015-10-08 DIAGNOSIS — J45909 Unspecified asthma, uncomplicated: Secondary | ICD-10-CM | POA: Insufficient documentation

## 2015-10-08 DIAGNOSIS — K8010 Calculus of gallbladder with chronic cholecystitis without obstruction: Secondary | ICD-10-CM | POA: Diagnosis present

## 2015-10-08 DIAGNOSIS — Z419 Encounter for procedure for purposes other than remedying health state, unspecified: Secondary | ICD-10-CM

## 2015-10-08 DIAGNOSIS — K219 Gastro-esophageal reflux disease without esophagitis: Secondary | ICD-10-CM | POA: Insufficient documentation

## 2015-10-08 DIAGNOSIS — Z6841 Body Mass Index (BMI) 40.0 and over, adult: Secondary | ICD-10-CM | POA: Insufficient documentation

## 2015-10-08 DIAGNOSIS — K7581 Nonalcoholic steatohepatitis (NASH): Secondary | ICD-10-CM | POA: Diagnosis not present

## 2015-10-08 HISTORY — PX: LAPAROSCOPIC CHOLECYSTECTOMY SINGLE SITE WITH INTRAOPERATIVE CHOLANGIOGRAM: SHX6538

## 2015-10-08 SURGERY — LAPAROSCOPIC CHOLECYSTECTOMY SINGLE SITE WITH INTRAOPERATIVE CHOLANGIOGRAM
Anesthesia: General

## 2015-10-08 MED ORDER — SODIUM CHLORIDE 0.9 % IJ SOLN
INTRAMUSCULAR | Status: DC | PRN
  Administered 2015-10-08: 15 mL

## 2015-10-08 MED ORDER — ONDANSETRON HCL 4 MG/2ML IJ SOLN
INTRAMUSCULAR | Status: DC | PRN
  Administered 2015-10-08: 4 mg via INTRAVENOUS

## 2015-10-08 MED ORDER — SUGAMMADEX SODIUM 200 MG/2ML IV SOLN
INTRAVENOUS | Status: DC | PRN
  Administered 2015-10-08: 150 mg via INTRAVENOUS

## 2015-10-08 MED ORDER — BUPIVACAINE-EPINEPHRINE 0.25% -1:200000 IJ SOLN
INTRAMUSCULAR | Status: DC | PRN
  Administered 2015-10-08: 50 mL

## 2015-10-08 MED ORDER — MIDAZOLAM HCL 2 MG/2ML IJ SOLN
0.5000 mg | INTRAMUSCULAR | Status: DC | PRN
  Administered 2015-10-08: 0.5 mg via INTRAVENOUS

## 2015-10-08 MED ORDER — NEOSTIGMINE METHYLSULFATE 10 MG/10ML IV SOLN
INTRAVENOUS | Status: AC
  Filled 2015-10-08: qty 1

## 2015-10-08 MED ORDER — ROCURONIUM BROMIDE 100 MG/10ML IV SOLN
INTRAVENOUS | Status: AC
  Filled 2015-10-08: qty 1

## 2015-10-08 MED ORDER — LIDOCAINE HCL (CARDIAC) 20 MG/ML IV SOLN
INTRAVENOUS | Status: DC | PRN
  Administered 2015-10-08: 70 mg via INTRATRACHEAL

## 2015-10-08 MED ORDER — GLYCOPYRROLATE 0.2 MG/ML IJ SOLN
INTRAMUSCULAR | Status: DC | PRN
  Administered 2015-10-08: 0.6 mg via INTRAVENOUS

## 2015-10-08 MED ORDER — HYDROCODONE-ACETAMINOPHEN 5-325 MG PO TABS
1.0000 | ORAL_TABLET | ORAL | Status: DC | PRN
Start: 2015-10-08 — End: 2015-10-08
  Administered 2015-10-08: 1 via ORAL
  Filled 2015-10-08: qty 1

## 2015-10-08 MED ORDER — SUGAMMADEX SODIUM 200 MG/2ML IV SOLN
INTRAVENOUS | Status: AC
  Filled 2015-10-08: qty 2

## 2015-10-08 MED ORDER — ONDANSETRON HCL 4 MG/2ML IJ SOLN
INTRAMUSCULAR | Status: AC
  Filled 2015-10-08: qty 2

## 2015-10-08 MED ORDER — MIDAZOLAM HCL 2 MG/2ML IJ SOLN
INTRAMUSCULAR | Status: DC | PRN
  Administered 2015-10-08: 2 mg via INTRAVENOUS

## 2015-10-08 MED ORDER — GLYCOPYRROLATE 0.2 MG/ML IJ SOLN
INTRAMUSCULAR | Status: AC
  Filled 2015-10-08: qty 3

## 2015-10-08 MED ORDER — HYDROCODONE-ACETAMINOPHEN 5-325 MG PO TABS: 1.0000 | ORAL_TABLET | ORAL | Status: AC | PRN

## 2015-10-08 MED ORDER — HYDROMORPHONE HCL 1 MG/ML IJ SOLN
0.2500 mg | INTRAMUSCULAR | Status: DC | PRN
  Administered 2015-10-08 (×3): 0.5 mg via INTRAVENOUS

## 2015-10-08 MED ORDER — CHLORHEXIDINE GLUCONATE 4 % EX LIQD: 1.0000 "application " | Freq: Once | CUTANEOUS | Status: DC

## 2015-10-08 MED ORDER — HYDROMORPHONE HCL 1 MG/ML IJ SOLN
INTRAMUSCULAR | Status: AC
  Filled 2015-10-08: qty 1

## 2015-10-08 MED ORDER — DEXAMETHASONE SODIUM PHOSPHATE 10 MG/ML IJ SOLN
INTRAMUSCULAR | Status: DC | PRN
  Administered 2015-10-08: 10 mg via INTRAVENOUS

## 2015-10-08 MED ORDER — 0.9 % SODIUM CHLORIDE (POUR BTL) OPTIME
TOPICAL | Status: DC | PRN
  Administered 2015-10-08: 1000 mL

## 2015-10-08 MED ORDER — FENTANYL CITRATE (PF) 250 MCG/5ML IJ SOLN
INTRAMUSCULAR | Status: AC
Start: 2015-10-08 — End: 2015-10-08
  Filled 2015-10-08: qty 5

## 2015-10-08 MED ORDER — BUPIVACAINE-EPINEPHRINE 0.25% -1:200000 IJ SOLN
INTRAMUSCULAR | Status: AC
  Filled 2015-10-08: qty 1

## 2015-10-08 MED ORDER — DEXAMETHASONE SODIUM PHOSPHATE 10 MG/ML IJ SOLN
INTRAMUSCULAR | Status: AC
  Filled 2015-10-08: qty 1

## 2015-10-08 MED ORDER — PROPOFOL 10 MG/ML IV BOLUS
INTRAVENOUS | Status: DC | PRN
  Administered 2015-10-08: 50 mg via INTRAVENOUS
  Administered 2015-10-08: 25 mg via INTRAVENOUS
  Administered 2015-10-08: 250 mg via INTRAVENOUS

## 2015-10-08 MED ORDER — FENTANYL CITRATE (PF) 250 MCG/5ML IJ SOLN
INTRAMUSCULAR | Status: DC | PRN
  Administered 2015-10-08 (×3): 50 ug via INTRAVENOUS
  Administered 2015-10-08: 100 ug via INTRAVENOUS

## 2015-10-08 MED ORDER — LACTATED RINGERS IV SOLN
INTRAVENOUS | Status: DC | PRN
  Administered 2015-10-08 (×2): via INTRAVENOUS

## 2015-10-08 MED ORDER — PROMETHAZINE HCL 25 MG/ML IJ SOLN: 6.2500 mg | INTRAMUSCULAR | Status: DC | PRN

## 2015-10-08 MED ORDER — ROCURONIUM BROMIDE 100 MG/10ML IV SOLN
INTRAVENOUS | Status: DC | PRN
  Administered 2015-10-08: 20 mg via INTRAVENOUS
  Administered 2015-10-08: 10 mg via INTRAVENOUS
  Administered 2015-10-08: 30 mg via INTRAVENOUS

## 2015-10-08 MED ORDER — NEOSTIGMINE METHYLSULFATE 10 MG/10ML IV SOLN
INTRAVENOUS | Status: DC | PRN
  Administered 2015-10-08: 4 mg via INTRAVENOUS

## 2015-10-08 MED ORDER — SUCCINYLCHOLINE CHLORIDE 20 MG/ML IJ SOLN
INTRAMUSCULAR | Status: DC | PRN
  Administered 2015-10-08: 140 mg via INTRAVENOUS

## 2015-10-08 MED ORDER — LACTATED RINGERS IR SOLN
Status: DC | PRN
  Administered 2015-10-08: 1

## 2015-10-08 MED ORDER — STERILE WATER FOR IRRIGATION IR SOLN
Status: DC | PRN
  Administered 2015-10-08: 1500 mL

## 2015-10-08 MED ORDER — MIDAZOLAM HCL 2 MG/2ML IJ SOLN
INTRAMUSCULAR | Status: AC
  Filled 2015-10-08: qty 2

## 2015-10-08 MED ORDER — LIDOCAINE HCL (CARDIAC) 20 MG/ML IV SOLN
INTRAVENOUS | Status: AC
  Filled 2015-10-08: qty 5

## 2015-10-08 MED ORDER — BUPIVACAINE-EPINEPHRINE (PF) 0.25% -1:200000 IJ SOLN
INTRAMUSCULAR | Status: AC
  Filled 2015-10-08: qty 30

## 2015-10-08 MED ORDER — FENTANYL CITRATE (PF) 250 MCG/5ML IJ SOLN
INTRAMUSCULAR | Status: AC
  Filled 2015-10-08: qty 5

## 2015-10-08 MED ORDER — CLINDAMYCIN PHOSPHATE 600 MG/50ML IV SOLN
600.0000 mg | INTRAVENOUS | Status: AC
  Administered 2015-10-08: 600 mg via INTRAVENOUS
  Filled 2015-10-08 (×2): qty 50

## 2015-10-08 MED ORDER — PROPOFOL 10 MG/ML IV BOLUS
INTRAVENOUS | Status: AC
  Filled 2015-10-08: qty 40

## 2015-10-08 SURGICAL SUPPLY — 38 items
APPLIER CLIP 5 13 M/L LIGAMAX5 (MISCELLANEOUS) ×3
CABLE HIGH FREQUENCY MONO STRZ (ELECTRODE) ×3 IMPLANT
CHLORAPREP W/TINT 26ML (MISCELLANEOUS) ×3 IMPLANT
CLIP APPLIE 5 13 M/L LIGAMAX5 (MISCELLANEOUS) ×1 IMPLANT
COVER MAYO STAND STRL (DRAPES) ×3 IMPLANT
COVER SURGICAL LIGHT HANDLE (MISCELLANEOUS) ×3 IMPLANT
DECANTER SPIKE VIAL GLASS SM (MISCELLANEOUS) ×3 IMPLANT
DRAIN CHANNEL 19F RND (DRAIN) IMPLANT
DRAPE C-ARM 42X120 X-RAY (DRAPES) ×3 IMPLANT
DRAPE LAPAROSCOPIC ABDOMINAL (DRAPES) ×3 IMPLANT
DRAPE WARM FLUID 44X44 (DRAPE) ×3 IMPLANT
DRSG TEGADERM 4X4.75 (GAUZE/BANDAGES/DRESSINGS) ×3 IMPLANT
ELECT REM PT RETURN 9FT ADLT (ELECTROSURGICAL) ×3
ELECTRODE REM PT RTRN 9FT ADLT (ELECTROSURGICAL) ×1 IMPLANT
ENDOLOOP SUT PDS II  0 18 (SUTURE)
ENDOLOOP SUT PDS II 0 18 (SUTURE) IMPLANT
EVACUATOR SILICONE 100CC (DRAIN) IMPLANT
GAUZE SPONGE 2X2 8PLY STRL LF (GAUZE/BANDAGES/DRESSINGS) ×1 IMPLANT
GLOVE ECLIPSE 8.0 STRL XLNG CF (GLOVE) ×3 IMPLANT
GLOVE INDICATOR 8.0 STRL GRN (GLOVE) ×3 IMPLANT
GOWN STRL REUS W/TWL XL LVL3 (GOWN DISPOSABLE) ×9 IMPLANT
KIT BASIN OR (CUSTOM PROCEDURE TRAY) ×3 IMPLANT
MARKER SKIN DUAL TIP RULER LAB (MISCELLANEOUS) ×3 IMPLANT
PAD POSITIONING PINK XL (MISCELLANEOUS) ×3 IMPLANT
POUCH SPECIMEN RETRIEVAL 10MM (ENDOMECHANICALS) IMPLANT
SCISSORS LAP 5X35 DISP (ENDOMECHANICALS) ×3 IMPLANT
SET CHOLANGIOGRAPH MIX (MISCELLANEOUS) ×3 IMPLANT
SET IRRIG TUBING LAPAROSCOPIC (IRRIGATION / IRRIGATOR) ×3 IMPLANT
SHEARS HARMONIC ACE PLUS 36CM (ENDOMECHANICALS) ×3 IMPLANT
SPONGE GAUZE 2X2 STER 10/PKG (GAUZE/BANDAGES/DRESSINGS) ×2
SUT MNCRL AB 4-0 PS2 18 (SUTURE) ×3 IMPLANT
SUT PDS AB 1 CT1 27 (SUTURE) ×6 IMPLANT
SYR 20CC LL (SYRINGE) ×3 IMPLANT
TOWEL OR 17X26 10 PK STRL BLUE (TOWEL DISPOSABLE) ×3 IMPLANT
TOWEL OR NON WOVEN STRL DISP B (DISPOSABLE) IMPLANT
TRAY LAPAROSCOPIC (CUSTOM PROCEDURE TRAY) ×3 IMPLANT
TROCAR BLADELESS OPT 5 100 (ENDOMECHANICALS) ×3 IMPLANT
TROCAR BLADELESS OPT 5 150 (ENDOMECHANICALS) ×6 IMPLANT

## 2015-10-08 NOTE — Anesthesia Procedure Notes (Signed)
Procedure Name: Intubation Date/Time: 10/08/2015 7:48 AM Performed by: Donna BernardRIMBLE, Leslie Delval H Pre-anesthesia Checklist: Patient identified, Emergency Drugs available, Suction available, Patient being monitored and Timeout performed Patient Re-evaluated:Patient Re-evaluated prior to inductionOxygen Delivery Method: Circle system utilized Preoxygenation: Pre-oxygenation with 100% oxygen Intubation Type: IV induction Ventilation: Mask ventilation without difficulty Laryngoscope Size: Mac and 4 Grade View: Grade I Tube type: Oral Tube size: 7.5 mm Number of attempts: 1 Airway Equipment and Method: Stylet and Patient positioned with wedge pillow Placement Confirmation: positive ETCO2,  ETT inserted through vocal cords under direct vision,  CO2 detector and breath sounds checked- equal and bilateral Secured at: 23 cm Tube secured with: Tape Dental Injury: Teeth and Oropharynx as per pre-operative assessment

## 2015-10-08 NOTE — Interval H&P Note (Signed)
History and Physical Interval Note:  10/08/2015 7:24 AM  Leslie Mitchell  has presented today for surgery, with the diagnosis of Chronic cholecystitis  The various methods of treatment have been discussed with the patient and family. After consideration of risks, benefits and other options for treatment, the patient has consented to  Procedure(s): LAPAROSCOPIC CHOLECYSTECTOMY SINGLE SITE WITH INTRAOPERATIVE CHOLANGIOGRAM (N/A) as a surgical intervention .  The patient's history has been reviewed, patient examined, no change in status, stable for surgery.  I have reviewed the patient's chart and labs.  Questions were answered to the patient's satisfaction.     Colbert Curenton C.

## 2015-10-08 NOTE — Discharge Instructions (Signed)
LAPAROSCOPIC SURGERY: POST OP INSTRUCTIONS ° °1. DIET: Follow a light bland diet the first 24 hours after arrival home, such as soup, liquids, crackers, etc.  Be sure to include lots of fluids daily.  Avoid fast food or heavy meals as your are more likely to get nauseated.  Eat a low fat the next few days after surgery.   °2. Take your usually prescribed home medications unless otherwise directed. °3. PAIN CONTROL: °a. Pain is best controlled by a usual combination of three different methods TOGETHER: °i. Ice/Heat °ii. Over the counter pain medication °iii. Prescription pain medication °b. Most patients will experience some swelling and bruising around the incisions.  Ice packs or heating pads (30-60 minutes up to 6 times a day) will help. Use ice for the first few days to help decrease swelling and bruising, then switch to heat to help relax tight/sore spots and speed recovery.  Some people prefer to use ice alone, heat alone, alternating between ice & heat.  Experiment to what works for you.  Swelling and bruising can take several weeks to resolve.   °c. It is helpful to take an over-the-counter pain medication regularly for the first few weeks.  Choose one of the following that works best for you: °i. Naproxen (Aleve, etc)  Two 220mg tabs twice a day °ii. Ibuprofen (Advil, etc) Three 200mg tabs four times a day (every meal & bedtime) °iii. Acetaminophen (Tylenol, etc) 500-650mg four times a day (every meal & bedtime) °d. A  prescription for pain medication (such as oxycodone, hydrocodone, etc) should be given to you upon discharge.  Take your pain medication as prescribed.  °i. If you are having problems/concerns with the prescription medicine (does not control pain, nausea, vomiting, rash, itching, etc), please call us [phone] to see if we need to switch you to a different pain medicine that will work better for you and/or control your side effect better. °ii. If you need a refill on your pain medication,  please contact your pharmacy.  They will contact our office to request authorization. Prescriptions will not be filled after 5 pm or on week-ends. °4. Avoid getting constipated.  Between the surgery and the pain medications, it is common to experience some constipation.  Increasing fluid intake and taking a fiber supplement (such as Metamucil, Citrucel, FiberCon, MiraLax, etc) 1-2 times a day regularly will usually help prevent this problem from occurring.  A mild laxative (prune juice, Milk of Magnesia, MiraLax, etc) should be taken according to package directions if there are no bowel movements after 48 hours.   °5. Watch out for diarrhea.  If you have many loose bowel movements, simplify your diet to bland foods & liquids for a few days.  Stop any stool softeners and decrease your fiber supplement.  Switching to mild anti-diarrheal medications (Kayopectate, Pepto Bismol) can help.  If this worsens or does not improve, please call us. °6. Wash / shower every day.  You may shower over the dressings as they are waterproof.  Continue to shower over incision(s) after the dressing is off. °7. Remove your waterproof bandages 5 days after surgery.  You may leave the incision open to air.  You may replace a dressing/Band-Aid to cover the incision for comfort if you wish.  °8. ACTIVITIES as tolerated:   °a. You may resume regular (light) daily activities beginning the next day--such as daily self-care, walking, climbing stairs--gradually increasing activities as tolerated.  If you can walk 30 minutes without difficulty, it   is safe to try more intense activity such as jogging, treadmill, bicycling, low-impact aerobics, swimming, etc. °b. Save the most intensive and strenuous activity for last such as sit-ups, heavy lifting, contact sports, etc  Refrain from any heavy lifting or straining until you are off narcotics for pain control.   °c. DO NOT PUSH THROUGH PAIN.  Let pain be your guide: If it hurts to do something, don't  do it.  Pain is your body warning you to avoid that activity for another week until the pain goes down. °d. You may drive when you are no longer taking prescription pain medication, you can comfortably wear a seatbelt, and you can safely maneuver your car and apply brakes. °e. You may have sexual intercourse when it is comfortable.  °9. FOLLOW UP in our office °a. Please call [facility] at [phone] to set up an appointment to see your surgeon in the office for a follow-up appointment approximately 2-3 weeks after your surgery. °b. Make sure that you call for this appointment the day you arrive home to insure a convenient appointment time. °10. IF YOU HAVE DISABILITY OR FAMILY LEAVE FORMS, BRING THEM TO THE OFFICE FOR PROCESSING.  DO NOT GIVE THEM TO YOUR DOCTOR. ° ° °WHEN TO CALL US [phone]: °1. Poor pain control °2. Reactions / problems with new medications (rash/itching, nausea, etc)  °3. Fever over 101.5 F (38.5 C) °4. Inability to urinate °5. Nausea and/or vomiting °6. Worsening swelling or bruising °7. Continued bleeding from incision. °8. Increased pain, redness, or drainage from the incision ° ° The clinic staff is available to answer your questions during regular business hours (8:30am-5pm).  Please don’t hesitate to call and ask to speak to one of our nurses for clinical concerns.  ° If you have a medical emergency, go to the nearest emergency room or call 911. ° A surgeon from [facility] is always on call at the hospitals ° ° °[facility] °[address], [address], [place], [place]  [address] ? °MAIN: [phone] ? TOLL FREE: [phone] ?  °FAX [phone] °[email or web address] ° °Managing Pain ° °Pain after surgery or related to activity is often due to strain/injury to muscle, tendon, nerves and/or incisions.  This pain is usually short-term and will improve in a few months.  ° °Many people find it helpful to do the following things TOGETHER  to help speed the process of healing and to get back to regular activity more quickly: ° °1. Avoid heavy physical activity at first °a. No lifting greater than 20 pounds at first, then increase to lifting as tolerated over the next few weeks °b. Do not “push through” the pain.  Listen to your body and avoid positions and maneuvers than reproduce the pain.  Wait a few days before trying something more intense °c. Walking is okay as tolerated, but go slowly and stop when getting sore.  If you can walk 30 minutes without stopping or pain, you can try more intense activity (running, jogging, aerobics, cycling, swimming, treadmill, sex, sports, weightlifting, etc ) °d. Remember: If it hurts to do it, then don’t do it! ° °2. Take Anti-inflammatory medication °a. Choose ONE of the following over-the-counter medications: °i.            Acetaminophen 500mg tabs (Tylenol) 1-2 pills with every meal and just before bedtime (avoid if you have liver problems) °ii.            Naproxen 220mg   tabs (ex. Aleve) 1-2 pills twice a day (avoid if you have kidney, stomach, IBD, or bleeding problems) °iii. Ibuprofen 200mg tabs (ex. Advil, Motrin) 3-4 pills with every meal and just before bedtime (avoid if you have kidney, stomach, IBD, or bleeding problems) °b. Take with food/snack around the clock for 1-2 weeks °i. This helps the muscle and nerve tissues become less irritable and calm down faster ° °3. Use a Heating pad or Ice/Cold Pack °a. 4-6 times a day °b. May use warm bath/hottub  or showers ° °4. Try Gentle Massage and/or Stretching  °a. at the area of pain many times a day °b. stop if you feel pain - do not overdo it ° °Try these steps together to help you body heal faster and avoid making things get worse.  Doing just one of these things may not be enough.   ° °If you are not getting better after two weeks or are noticing you are getting worse, contact our office for further advice; we may need to re-evaluate you & see what other  things we can do to help. ° °GETTING TO GOOD BOWEL HEALTH. °Irregular bowel habits such as constipation and diarrhea can lead to many problems over time.  Having one soft bowel movement a day is the most important way to prevent further problems.  The anorectal canal is designed to handle stretching and feces to safely manage our ability to get rid of solid waste (feces, poop, stool) out of our body.  BUT, hard constipated stools can act like ripping concrete bricks and diarrhea can be a burning fire to this very sensitive area of our body, causing inflamed hemorrhoids, anal fissures, increasing risk is perirectal abscesses, abdominal pain/bloating, an making irritable bowel worse.     ° °The goal: ONE SOFT BOWEL MOVEMENT A DAY!  To have soft, regular bowel movements:  °• Drink plenty of fluids, consider 4-6 tall glasses of water a day.   °• Take plenty of fiber.  Fiber is the undigested part of plant food that passes into the colon, acting s “natures broom” to encourage bowel motility and movement.  Fiber can absorb and hold large amounts of water. This results in a larger, bulkier stool, which is soft and easier to pass. Work gradually over several weeks up to 6 servings a day of fiber (25g a day even more if needed) in the form of: °o Vegetables -- Root (potatoes, carrots, turnips), leafy green (lettuce, salad greens, celery, spinach), or cooked high residue (cabbage, broccoli, etc) °o Fruit -- Fresh (unpeeled skin & pulp), Dried (prunes, apricots, cherries, etc ),  or stewed ( applesauce)  °o Whole grain breads, pasta, etc (whole wheat)  °o Bran cereals  °• Bulking Agents -- This type of water-retaining fiber generally is easily obtained each day by one of the following:  °o Psyllium bran -- The psyllium plant is remarkable because its ground seeds can retain so much water. This product is available as Metamucil, Konsyl, Effersyllium, Per Diem Fiber, or the less expensive generic preparation in drug and health  food stores. Although labeled a laxative, it really is not a laxative.  °o Methylcellulose -- This is another fiber derived from wood which also retains water. It is available as Citrucel. °o Polyethylene Glycol - and “artificial” fiber commonly called Miralax or Glycolax.  It is helpful for people with gassy or bloated feelings with regular fiber °o Flax Seed - a less gassy fiber than psyllium °• No reading or   other relaxing activity while on the toilet. If bowel movements take longer than 5 minutes, you are too constipated °• AVOID CONSTIPATION.  High fiber and water intake usually takes care of this.  Sometimes a laxative is needed to stimulate more frequent bowel movements, but  °• Laxatives are not a good long-term solution as it can wear the colon out.  They can help jump-start bowels if constipated, but should be relied on constantly without discussing with your doctor °o Osmotics (Milk of Magnesia, Fleets phosphosoda, Magnesium citrate, MiraLax, GoLytely) are safer than  °o Stimulants (Senokot, Castor Oil, Dulcolax, Ex Lax)    °o Avoid taking laxatives for more than 7 days in a row. °•  IF SEVERELY CONSTIPATED, try a Bowel Retraining Program: °o Do not use laxatives.  °o Eat a diet high in roughage, such as bran cereals and leafy vegetables.  °o Drink six (6) ounces of prune or apricot juice each morning.  °o Eat two (2) large servings of stewed fruit each day.  °o Take one (1) heaping tablespoon of a psyllium-based bulking agent twice a day. Use sugar-free sweetener when possible to avoid excessive calories.  °o Eat a normal breakfast.  °o Set aside 15 minutes after breakfast to sit on the toilet, but do not strain to have a bowel movement.  °o If you do not have a bowel movement by the third day, use an enema and repeat the above steps.  °• Controlling diarrhea °o Switch to liquids and simpler foods for a few days to avoid stressing your intestines further. °o Avoid dairy products (especially milk & ice  cream) for a short time.  The intestines often can lose the ability to digest lactose when stressed. °o Avoid foods that cause gassiness or bloating.  Typical foods include beans and other legumes, cabbage, broccoli, and dairy foods.  Every person has some sensitivity to other foods, so listen to our body and avoid those foods that trigger problems for you. °o Adding fiber (Citrucel, Metamucil, psyllium, Miralax) gradually can help thicken stools by absorbing excess fluid and retrain the intestines to act more normally.  Slowly increase the dose over a few weeks.  Too much fiber too soon can backfire and cause cramping & bloating. °o Probiotics (such as active yogurt, Align, etc) may help repopulate the intestines and colon with normal bacteria and calm down a sensitive digestive tract.  Most studies show it to be of mild help, though, and such products can be costly. °o Medicines: °- Bismuth subsalicylate (ex. Kayopectate, Pepto Bismol) every 30 minutes for up to 6 doses can help control diarrhea.  Avoid if pregnant. °- Loperamide (Immodium) can slow down diarrhea.  Start with two tablets (4mg total) first and then try one tablet every 6 hours.  Avoid if you are having fevers or severe pain.  If you are not better or start feeling worse, stop all medicines and call your doctor for advice °o Call your doctor if you are getting worse or not better.  Sometimes further testing (cultures, endoscopy, X-ray studies, bloodwork, etc) may be needed to help diagnose and treat the cause of the diarrhea. ° °TROUBLESHOOTING IRREGULAR BOWELS °1) Avoid extremes of bowel movements (no bad constipation/diarrhea) °2) Miralax 17gm mixed in 8oz. water or juice-daily. May use BID as needed.  °3) Gas-x,Phazyme, etc. as needed for gas & bloating.  °4) Soft,bland diet. No spicy,greasy,fried foods.  °5) Prilosec over-the-counter as needed  °6) May hold gluten/wheat products from diet to see   if symptoms improve.  °7)  May try probiotics  (Align, Activa, etc) to help calm the bowels down °7) If symptoms become worse call back immediately. ° °Cholecystitis °Cholecystitis is inflammation of the gallbladder. It is often called a gallbladder attack. The gallbladder is a pear-shaped organ that lies beneath the liver on the right side of the body. The gallbladder stores bile, which is a fluid that helps the body to digest fats. If bile builds up in your gallbladder, your gallbladder becomes inflamed. This condition may occur suddenly (be acute). Repeat episodes of acute cholecystitis or prolonged episodes may lead to a long-term (chronic) condition. Cholecystitis is serious and it requires treatment.  °CAUSES °The most common cause of this condition is gallstones. Gallstones can block the tube (duct) that carries bile out of your gallbladder. This causes bile to build up. Other causes of this condition include: °· Damage to the gallbladder due to a decrease in blood flow. °· Infections in the bile ducts. °· Scars or kinks in the bile ducts. °· Tumors in the liver, pancreas, or gallbladder. °RISK FACTORS °This condition is more likely to develop in: °· People who have sickle cell disease. °· People who take birth control pills or use estrogen. °· People who have alcoholic liver disease. °· People who have liver cirrhosis. °· People who have their nutrition delivered through a vein (parenteral nutrition). °· People who do not eat or drink (do fasting) for a long period of time. °· People who are obese. °· People who have rapid weight loss. °· People who are pregnant. °· People who have increased triglyceride levels. °· People who have pancreatitis. °SYMPTOMS °Symptoms of this condition include: °· Abdominal pain, especially in the upper right area of the abdomen. °· Abdominal tenderness or bloating. °· Nausea. °· Vomiting. °· Fever. °· Chills. °· Yellowing of the skin and the whites of the eyes (jaundice). °DIAGNOSIS °This condition is diagnosed with a  medical history and physical exam. You may also have other tests, including: °· Imaging tests, such as: °· An ultrasound of the gallbladder. °· A CT scan of the abdomen. °· A gallbladder nuclear scan (HIDA scan). This scan allows your health care provider to see the bile moving from your liver to your gallbladder and to your small intestine. °· MRI. °· Blood tests, such as: °· A complete blood count, because the white blood cell count may be higher than normal. °· Liver function tests, because some levels may be higher than normal with certain types of gallstones. °TREATMENT °Treatment may include: °· Fasting for a certain amount of time. °· IV fluids. °· Medicine to treat pain or vomiting. °· Antibiotic medicine. °· Surgery to remove your gallbladder (cholecystectomy). This may happen immediately or at a later time. °HOME CARE INSTRUCTIONS °Home care will depend on your treatment. In general: °· Take over-the-counter and prescription medicines only as told by your health care provider. °· If you were prescribed an antibiotic medicine, take it as told by your health care provider. Do not stop taking the antibiotic even if you start to feel better. °· Follow instructions from your health care provider about what to eat or drink. When you are allowed to eat, avoid eating or drinking anything that triggers your symptoms. °· Keep all follow-up visits as told by your health care provider. This is important. °SEEK MEDICAL CARE IF: °· Your pain is not controlled with medicine. °· You have a fever. °SEEK IMMEDIATE MEDICAL CARE IF: °· Your pain moves   to another part of your abdomen or to your back. °· You continue to have symptoms or you develop new symptoms even with treatment. °  °This information is not intended to replace advice given to you by your health care provider. Make sure you discuss any questions you have with your health care provider. °  °Document Released: 10/03/2005 Document Revised: 06/24/2015 Document  Reviewed: 01/14/2015 °Elsevier Interactive Patient Education ©2016 [employer] ° ° ° °General Anesthesia, Adult, Care After °Refer to this sheet in the next few weeks. These instructions provide you with information on caring for yourself after your procedure. Your health care provider may also give you more specific instructions. Your treatment has been planned according to current medical practices, but problems sometimes occur. Call your health care provider if you have any problems or questions after your procedure. °WHAT TO EXPECT AFTER THE PROCEDURE °After the procedure, it is typical to experience: °· Sleepiness. °· Nausea and vomiting. °HOME CARE INSTRUCTIONS °· For the first 24 hours after general anesthesia: °¨ Have a responsible person with you. °¨ Do not drive a car. If you are alone, do not take public transportation. °¨ Do not drink alcohol. °¨ Do not take medicine that has not been prescribed by your health care provider. °¨ Do not sign important papers or make important decisions. °¨ You may resume a normal diet and activities as directed by your health care provider. °· Change bandages (dressings) as directed. °· If you have questions or problems that seem related to general anesthesia, call the hospital and ask for the anesthetist or anesthesiologist on call. °SEEK MEDICAL CARE IF: °· You have nausea and vomiting that continue the day after anesthesia. °· You develop a rash. °SEEK IMMEDIATE MEDICAL CARE IF:  °· You have difficulty breathing. °· You have chest pain. °· You have any allergic problems. °  °This information is not intended to replace advice given to you by your health care provider. Make sure you discuss any questions you have with your health care provider. °  °Document Released: 01/09/2001 Document Revised: 10/24/2014 Document Reviewed: 02/01/2012 °Elsevier Interactive Patient Education ©2016 [employer] ° °

## 2015-10-08 NOTE — Op Note (Signed)
10/08/2015  9:29 AM  PATIENT:  Leslie Mitchell  24 y.o. female  No care team member to display  PRE-OPERATIVE DIAGNOSIS:  Chronic cholecystitis  POST-OPERATIVE DIAGNOSIS:  Chronic cholecystitis with cholecystolithiasis  PROCEDURE:  Procedure(s): LAPAROSCOPIC CHOLECYSTECTOMY SINGLE SITE WITH INTRAOPERATIVE CHOLANGIOGRAM  SURGEON:  Surgeon(s): Karie Soda, MD  ASSISTANT: RN   ANESTHESIA:   local and general  EBL:     Delay start of Pharmacological VTE agent (>24hrs) due to surgical blood loss or risk of bleeding:  no  DRAINS:  none   SPECIMEN:  Source of Specimen:   Gallbladder   DISPOSITION OF SPECIMEN:  PATHOLOGY  COUNTS:  YES  PLAN OF CARE: Discharge to home after PACU  PATIENT DISPOSITION:  PACU - hemodynamically stable.  INDICATION:  Pleasant morbidly obese female.  Intentionally losing weight with low-fat diet and increase exercise.  Has had intermittent episodes of nausea and vomiting and biliary colic.  Pain so intense went to emergency room.  Differential diagnosis otherwise unlikely.  Ultrasound showing gallstones and sludge.  I offered cholecystectomy  The anatomy & physiology of hepatobiliary & pancreatic function was discussed.  The pathophysiology of gallbladder dysfunction was discussed.  Natural history risks without surgery was discussed.   I feel the risks of no intervention will lead to serious problems that outweigh the operative risks; therefore, I recommended cholecystectomy to remove the pathology.  I explained laparoscopic techniques with possible need for an open approach.  Probable cholangiogram to evaluate the bilary tract was explained as well.    Risks such as bleeding, infection, abscess, leak, injury to other organs, need for further treatment, heart attack, death, and other risks were discussed.  I noted a good likelihood this will help address the problem.  Possibility that this will not correct all abdominal symptoms was explained.  Goals of  post-operative recovery were discussed as well.  We will work to minimize complications.  An educational handout further explaining the pathology and treatment options was given as well.  Questions were answered.  The patient expresses understanding & wishes to proceed with surgery.  OR FINDINGS: Mild fatty change status/steatohepatitis of the liver.  Trunk and thickened gallbladder with solitary stone.  Very narrow cystic duct. Biliary system mildly narrow but classic anatomy.  No evidence of leak obstruction or stricture.  No intra-abdominal adhesions.  No other concerns.  DESCRIPTION:   The patient was identified & brought in the operating room. The patient was positioned supine with arms tucked. SCDs were active during the entire case. The patient underwent general anesthesia without any difficulty.  The abdomen was prepped and draped in a sterile fashion. A Surgical Timeout confirmed our plan.  I made a transverse curvilinear incision through the superior umbilical fold.  I placed a 5mm long port through the supraumbilical fascia using a modified Hassan cutdown technique. I began carbon dioxide insufflation. Camera inspection revealed no injury. There were no adhesions to the anterior abdominal wall supraumbilically.  I proceeded to continue with single site technique. I placed a #5 port in left upper aspect of the wound. I placed a 5 mm atraumatic grasper in the right inferior aspect of the wound.  I turned attention to the right upper quadrant.  The gallbladder was shrunken with a mildly thickened wall consistent with chronic cholecystitis.  What intrahepatic with enlarged right hepatic lobe.  The gallbladder fundus was elevated cephalad. I freed the peritoneal coverings between the gallbladder and the liver on the posteriolateral and anteriomedial walls. I alternated between  Harmonic & blunt Maryland dissection to help get a good critical view of the cystic artery and cystic duct. I did  further dissection to free 90% of the gallbladder off the liver bed to get a good critical view of the infundibulum and cystic duct. I mobilized the cystic artery; and, after getting a good 360 view, ligated the cystic artery using titanium clips x 2 and the Harmonic ultrasonic dissection.   I skeletonized the cystic duct.  I placed a clip on the infundibulum. I did a partial cystic duct-otomy and ensured patency. I placed a 5 JamaicaFrench cholangiocatheter through a puncture site at the right subcostal ridge of the abdominal wall and directed it into the cystic duct.  We ran a cholangiogram with dilute radio-opaque contrast and continuous fluoroscopy.  Contrast flowed from a side branch consistent with cystic duct cannulization. Contrast flowed up the common hepatic duct into the right and left intrahepatic chains out to secondary radicals. Contrast flowed easily down the common bile duct easily across the normal ampulla into the duodenum.  This was consistent with a normal cholangiogram.  I removed the cholangiocatheter. I placed clips on the cystic duct x4.  I completed cystic duct transection. I freed the gallbladder from its remaining attachments to the liver.  I did place a clip along the medial edge near the dome of the gallbladder on the gallbladder fossa of the liver.   I ensured hemostasis on the gallbladder fossa of the liver and elsewhere. I inspected the rest of the abdomen & detected no injury nor bleeding elsewhere.  I did copious irrigation.  There been some mild bile spillage but no stones  I removed the gallbladder out the supraumbilical fascia inside an Endo Catch bag.  Only needed to dilated to 10 mm to get it out.  Patient had a solitary gallstone consistent with the ultrasound report.. I closed the fascia transversely using #1 PDS and 0 Vicryl interrupted stitches. A closed the skin using 4-0 monocryl stitch.  Sterile dressing was applied. The patient was extubated & arrived in the PACU in  stable condition.  She was mildly anxious but consolable..  I had discussed postoperative care with the patient in the holding area. I updated the status of the patient to the patient's mother.  I made recommendations.  I answered questions.  Understanding & appreciation was expressed.  Instructions are written in the chart as well.  Ardeth SportsmanSteven C. Keithan Dileonardo, M.D., F.A.C.S. Gastrointestinal and Minimally Invasive Surgery [facility] [address] [place], [place] [address] [phone] Main / Paging

## 2015-10-08 NOTE — Progress Notes (Signed)
Patient arrives to 1307 after laparoscopic cholecystectomy. Her biggest complaint is throat pain which RN treats with salt water gargles. OOB to recliner as patient has very pendulous abdomen. Parents and sister at chair side.

## 2015-10-08 NOTE — Anesthesia Preprocedure Evaluation (Addendum)
Anesthesia Evaluation  Patient identified by MRN, date of birth, ID band Patient awake    Reviewed: Allergy & Precautions, H&P , NPO status , Patient's Chart, lab work & pertinent test results  History of Anesthesia Complications Negative for: history of anesthetic complications  Airway Mallampati: II  TM Distance: >3 FB Neck ROM: full   Comment: Actually has favorable airway exam, good thyromental distance, wide mouth opening, mallampati 1-2, good neck extension Dental  (+) Missing   Pulmonary shortness of breath and with exertion,    Pulmonary exam normal breath sounds clear to auscultation       Cardiovascular negative cardio ROS Normal cardiovascular exam Rhythm:regular Rate:Normal     Neuro/Psych negative neurological ROS     GI/Hepatic negative GI ROS, Neg liver ROS,   Endo/Other  Morbid obesity  Renal/GU negative Renal ROS     Musculoskeletal   Abdominal (+) + obese,   Peds  Hematology negative hematology ROS (+)   Anesthesia Other Findings   Reproductive/Obstetrics negative OB ROS                            Anesthesia Physical Anesthesia Plan  ASA: III  Anesthesia Plan: General   Post-op Pain Management:    Induction: Intravenous  Airway Management Planned: Oral ETT  Additional Equipment:   Intra-op Plan:   Post-operative Plan: Extubation in OR  Informed Consent: I have reviewed the patients History and Physical, chart, labs and discussed the procedure including the risks, benefits and alternatives for the proposed anesthesia with the patient or authorized representative who has indicated his/her understanding and acceptance.   Dental Advisory Given  Plan Discussed with: Anesthesiologist and CRNA  Anesthesia Plan Comments: (Patient is super morbidly obese, GA with ETT, triple antiemetic therapy)        Anesthesia Quick Evaluation

## 2015-10-08 NOTE — H&P (View-Only) (Signed)
Leslie Mitchell 09/15/2015 11:17 AM Location: [facility] Patient #: [id number] DOB: [birth year] Single / Language: [name] / Race: White Female  History of Present Illness [name]; 09/15/2015 11:46 AM) The patient is a 24 year old female who presents for evaluation of gall stones. Patient sent from the emergency room [name], PA-C & [name], [place] [facility]) after visit on Thanksgiving 11/24. Concern for symptomatic gallstones.  Pleasant morbidly obese female with attacks of epigastric abdominal pain radiating to RIGHT side. Happen last Tuesday, 6 days ago, after eating lunch. Then happened again on Thanksgiving. She is morbidly obese. She has intentionally lost about 90 pounds over the past year with a more strict dieting regimen. She does confess that around Thanksgiving she underwent off the wagon. The second attack did she try and use milked calm things down upon the recommendation of a friend that is a nurse who thought maybe it was reflux. That backfired and she had severe vomiting, thus triggering going to the emergency room. Workup ruled out other etiologies. Ultrasound did show gallbladder with sludge and an 8 mm solitary stone. She felt better with pain and nausea medicines. No and has been sick around her. No recent travel history. No history of Crohn's or irritable bowel. No inflammatory bowel disease. No family history of data tract disorders. She rarely drinks alcohol. No history of hepatitis or pancreatitis. She had gotten no relief on these attacks with antacid medications. She denies much in way of heartburn or reflux.   She normally can walk an hour a day without much difficulty. No prior abdominal surgeries. Again weight was 391. Now around 300.        Results for Leslie Mitchell, Leslie Mitchell (MRN [id number]) as of 09/15/2015 11:25 Ref. Range 09/10/2015 21:31 09/10/2015 21:36 Sodium Latest Ref Range: 135-145 mmol/L 141 Potassium  Latest Ref Range: 3.5-5.1 mmol/L 4.1 Chloride Latest Ref Range: 101-111 mmol/L 107 CO2 Latest Ref Range: 22-32 mmol/L 23 BUN Latest Ref Range: 6-20 mg/dL 21 (H) Creatinine Latest Ref Range: 0.44-1.00 mg/dL 0.86 Calcium Latest Ref Range: 8.9-10.3 mg/dL 10.0 EGFR (Non-African Amer.) Latest Ref Range: >60 mL/min >60 EGFR (African American) Latest Ref Range: >60 mL/min >60 Glucose Latest Ref Range: 65-99 mg/dL 116 (H) Anion gap Latest Ref Range: 5-15 11 Alkaline Phosphatase Latest Ref Range: 38-126 U/L 45 Albumin Latest Ref Range: 3.5-5.0 g/dL 4.4 Lipase Latest Ref Range: 11-51 U/L 28 AST Latest Ref Range: 15-41 U/L 17 ALT Latest Ref Range: 14-54 U/L 15 Total Protein Latest Ref Range: 6.5-8.1 g/dL 8.0 Total Bilirubin Latest Ref Range: 0.3-1.2 mg/dL 0.5 WBC Latest Ref Range: 4.0-10.5 K/uL 11.6 (H) RBC Latest Ref Range: 3.87-5.11 MIL/uL 4.73 Hemoglobin Latest Ref Range: 12.0-15.0 g/dL 12.3 HCT Latest Ref Range: 36.0-46.0 % 39.0 MCV Latest Ref Range: 78.0-100.0 fL 82.5 MCH Latest Ref Range: 26.0-34.0 pg 26.0 MCHC Latest Ref Range: 30.0-36.0 g/dL 31.5 RDW Latest Ref Range: 11.5-15.5 % 15.8 (H) Platelets Latest Ref Range: 150-400 K/uL 330 Preg Test, Ur Latest Ref Range: NEGATIVE NEGATIVE Appearance Latest Ref Range: CLEAR TURBID (A) Bacteria, UA Latest Ref Range: NONE SEEN RARE (A) Bilirubin Urine Latest Ref Range: NEGATIVE NEGATIVE Color, Urine Latest Ref Range: YELLOW YELLOW Glucose Latest Ref Range: NEGATIVE mg/dL NEGATIVE Hgb urine dipstick Latest Ref Range: NEGATIVE NEGATIVE Ketones, ur Latest Ref Range: NEGATIVE mg/dL NEGATIVE Leukocytes, UA Latest Ref Range: NEGATIVE SMALL (A) Nitrite Latest Ref Range: NEGATIVE NEGATIVE pH Latest Ref Range: 5.0-8.0 7.5 Protein Latest Ref Range: NEGATIVE mg/dL NEGATIVE RBC /  HPF Latest Ref Range: 0-5 RBC/hpf NONE SEEN Specific Gravity, Urine Latest Ref Range: 1.005-1.030 1.020 Squamous Epithelial / LPF Latest Ref Range: NONE SEEN 0-5  (A) Urine-Other Unknown AMORPHOUS URATES/... WBC, UA Latest Ref Range: 0-5 WBC/hpf 0-5      CLINICAL DATA: Right upper quadrant pain since Tuesday.  EXAM: US ABDOMEN LIMITED - RIGHT UPPER QUADRANT  COMPARISON: None.  FINDINGS: Gallbladder:  Stone in the dependent gallbladder with mild sludge layering. Stone measures about 8 mm maximal diameter. No gallbladder wall thickening. Murphy's sign is negative.  Common bile duct:  Diameter: 5.3 mm, normal  Liver:  No focal lesion identified. Within normal limits in parenchymal echogenicity.  IMPRESSION: Cholelithiasis with a single stone and mild sludge layering. No secondary signs to suggest cholecystitis.   Electronically Signed By: Lucienne Capers M.D. On: 09/11/2015 00:18      Problem List/Past Medical [name], MD; 09/15/2015 11:48 AM) MORBID OBESITY, UNSPECIFIED OBESITY TYPE (E66.01)  Other Problems [name], MD; 09/15/2015 11:48 AM) Anxiety Disorder Asthma Back Pain Cholelithiasis Gastroesophageal Reflux Disease High blood pressure  Past Surgical History Elbert Ewings, CMA; 09/15/2015 11:17 AM) No pertinent past surgical history  Diagnostic Studies History Elbert Ewings, CMA; 09/15/2015 11:17 AM) Colonoscopy never Mammogram never Pap Smear >5 years ago  Allergies Elbert Ewings, CMA; 09/15/2015 11:18 AM) Penicillin VK *PENICILLINS* Hives.  Medication History Elbert Ewings, CMA; 09/15/2015 11:18 AM) Ibuprofen (800MG Tablet, Oral) Active. Ondansetron (4MG Tablet Disperse, Oral) Active. Allegra (30MG Tablet, Oral) Active. Medications Reconciled  Pregnancy / Birth History Elbert Ewings, CMA; 09/15/2015 11:17 AM) Age at menarche 17 years. Gravida 0 Para 0 Regular periods     Review of Systems Elbert Ewings CMA; 09/15/2015 11:17 AM) General Present- Fatigue. Not Present- Appetite Loss, Chills, Fever, Night Sweats, Weight Gain and Weight Loss. Skin Present-  Ulcer. Not Present- Change in Wart/Mole, Dryness, Hives, Jaundice, New Lesions, Non-Healing Wounds and Rash. HEENT Present- Seasonal Allergies and Wears glasses/contact lenses. Not Present- Earache, Hearing Loss, Hoarseness, Nose Bleed, Oral Ulcers, Ringing in the Ears, Sinus Pain, Sore Throat, Visual Disturbances and Yellow Eyes. Respiratory Present- Snoring. Not Present- Bloody sputum, Chronic Cough, Difficulty Breathing and Wheezing. Gastrointestinal Present- Change in Bowel Habits. Not Present- Abdominal Pain, Bloating, Bloody Stool, Chronic diarrhea, Constipation, Difficulty Swallowing, Excessive gas, Gets full quickly at meals, Hemorrhoids, Indigestion, Nausea, Rectal Pain and Vomiting. Psychiatric Present- Anxiety and Frequent crying. Not Present- Bipolar, Change in Sleep Pattern, Depression and Fearful.  Vitals Elbert Ewings CMA; 09/15/2015 11:18 AM) 09/15/2015 11:18 AM Weight: 306.6 lb Height: 63in Body Surface Area: 2.32 m Body Mass Index: 54.31 kg/m  Temp.: 22F(Temporal)  Pulse: 84 (Regular)  BP: 120/82 (Sitting, Left Arm, Standard)      Physical Exam [name]; 09/15/2015 11:47 AM)  General Mental Status-Alert. General Appearance-Not in acute distress, Not Sickly. Orientation-Oriented X3. Hydration-Well hydrated. Voice-Normal.  Integumentary Global Assessment Upon inspection and palpation of skin surfaces of the - Axillae: non-tender, no inflammation or ulceration, no drainage. and Distribution of scalp and body hair is normal. General Characteristics Temperature - normal warmth is noted.  Head and Neck Head-normocephalic, atraumatic with no lesions or palpable masses. Face Global Assessment - atraumatic, no absence of expression. Neck Global Assessment - no abnormal movements, no bruit auscultated on the right, no bruit auscultated on the left, no decreased range of motion, non-tender. Trachea-midline. Thyroid Gland  Characteristics - non-tender.  Eye Eyeball - Left-Extraocular movements intact, No Nystagmus. Eyeball - Right-Extraocular movements intact, No Nystagmus. Cornea -  Left-No Hazy. Cornea - Right-No Hazy. Sclera/Conjunctiva - Left-No scleral icterus, No Discharge. Sclera/Conjunctiva - Right-No scleral icterus, No Discharge. Pupil - Left-Direct reaction to light normal. Pupil - Right-Direct reaction to light normal.  ENMT Ears Pinna - Left - no drainage observed, no generalized tenderness observed. Right - no drainage observed, no generalized tenderness observed. Nose and Sinuses External Inspection of the Nose - no destructive lesion observed. Inspection of the nares - Left - quiet respiration. Right - quiet respiration. Mouth and Throat Lips - Upper Lip - no fissures observed, no pallor noted. Lower Lip - no fissures observed, no pallor noted. Nasopharynx - no discharge present. Oral Cavity/Oropharynx - Tongue - no dryness observed. Oral Mucosa - no cyanosis observed. Hypopharynx - no evidence of airway distress observed.  Chest and Lung Exam Inspection Movements - Normal and Symmetrical. Accessory muscles - No use of accessory muscles in breathing. Palpation Palpation of the chest reveals - Non-tender. Auscultation Breath sounds - Normal and Clear.  Cardiovascular Auscultation Rhythm - Regular. Murmurs & Other Heart Sounds - Auscultation of the heart reveals - No Murmurs and No Systolic Clicks.  Abdomen Inspection Inspection of the abdomen reveals - No Visible peristalsis and No Abnormal pulsations. Umbilicus - No Bleeding, No Urine drainage. Palpation/Percussion Palpation and Percussion of the abdomen reveal - Soft, Non Tender, No Rebound tenderness, No Rigidity (guarding) and No Cutaneous hyperesthesia. Note: Morbid obese but soft. Mild epigastric and RIGHT upper discomfort but no Murphy sign. Moderate sized panniculus with some mild maceration but hygiene  otherwise good.  Female Genitourinary Sexual Maturity Tanner 5 - Adult hair pattern. Note: No vaginal bleeding nor discharge. No inguinal hernias. No rash under panniculus. She claims she has a history of hydradenitis. I did not see any significant inflammation site from some mild skin maceration  Peripheral Vascular Upper Extremity Inspection - Left - No Cyanotic nailbeds, Not Ischemic. Right - No Cyanotic nailbeds, Not Ischemic.  Neurologic Neurologic evaluation reveals -normal attention span and ability to concentrate, able to name objects and repeat phrases. Appropriate fund of knowledge , normal sensation and normal coordination. Mental Status Affect - not angry, not paranoid. Cranial Nerves-Normal Bilaterally. Gait-Normal.  Neuropsychiatric Mental status exam performed with findings of-able to articulate well with normal speech/language, rate, volume and coherence, thought content normal with ability to perform basic computations and apply abstract reasoning and no evidence of hallucinations, delusions, obsessions or homicidal/suicidal ideation.  Musculoskeletal Global Assessment Spine, Ribs and Pelvis - no instability, subluxation or laxity. Right Upper Extremity - no instability, subluxation or laxity.  Lymphatic Head & Neck  General Head & Neck Lymphatics: Bilateral - Description - No Localized lymphadenopathy. Axillary  General Axillary Region: Bilateral - Description - No Localized lymphadenopathy. Femoral & Inguinal  Generalized Femoral & Inguinal Lymphatics: Left - Description - No Localized lymphadenopathy. Right - Description - No Localized lymphadenopathy.    Assessment & Plan [name]; 09/15/2015 11:44 AM)  CHRONIC CHOLECYSTITIS WITH CALCULUS (K80.10) Impression: Classic story of biliary colic with severe attack most recently in Thanksgiving. Rest of the differential diagnosis seems unlikely.  I think she would benefit from  cholecystectomy. Especially in light of the intense attacks and young age, risk of further problems is very high. She probably cannot wait a few months. She was concerned about the finances so we'll have our scheduler work with her to make sure we can do it safely and yet not overly strain her financially.  Current Plans You are being scheduled for surgery -  Our schedulers will call you.  You should hear from our office's scheduling department within 5 working days about the location, date, and time of surgery. We try to make accommodations for patient's preferences in scheduling surgery, but sometimes the OR schedule or the surgeon's schedule prevents [place] from making those accommodations.  If you have not heard from our office [phone]) in 5 working days, call the office and ask for your surgeon's nurse.  If you have other questions about your diagnosis, plan, or surgery, call the office and ask for your surgeon's nurse.  Pt Education - Pamphlet Given - Laparoscopic Gallbladder Surgery: discussed with patient and provided information. Written instructions provided The anatomy & physiology of hepatobiliary & pancreatic function was discussed. The pathophysiology of gallbladder dysfunction was discussed. Natural history risks without surgery was discussed. I feel the risks of no intervention will lead to serious problems that outweigh the operative risks; therefore, I recommended cholecystectomy to remove the pathology. I explained laparoscopic techniques with possible need for an open approach. Probable cholangiogram to evaluate the bilary tract was explained as well.  Risks such as bleeding, infection, abscess, leak, injury to other organs, need for further treatment, heart attack, death, and other risks were discussed. I noted a good likelihood this will help address the problem. Possibility that this will not correct all abdominal symptoms was explained. Goals of post-operative recovery were  discussed as well. We will work to minimize complications. An educational handout further explaining the pathology and treatment options was given as well. Questions were answered. The patient expresses understanding & wishes to proceed with surgery.  Pt Education - [facility] Laparosopic Post Op HCI (Anica Alcaraz) Pt Education - [facility] Good Bowel Health (Kyndahl Jablon) Pt Education - Laparoscopic Cholecystectomy: gallbladder  [name], M.D., F.A.C.S. Gastrointestinal and Minimally Invasive Surgery [facility] [address], [employer] [place], [place] [address] [phone] Main / Paging

## 2015-10-08 NOTE — Anesthesia Postprocedure Evaluation (Signed)
Anesthesia Post Note  Patient: Leslie Mitchell  Procedure(s) Performed: Procedure(s) (LRB): LAPAROSCOPIC CHOLECYSTECTOMY SINGLE SITE WITH INTRAOPERATIVE CHOLANGIOGRAM (N/A)  Patient location during evaluation: PACU Anesthesia Type: General Level of consciousness: awake and alert Pain management: pain level controlled Vital Signs Assessment: post-procedure vital signs reviewed and stable Respiratory status: spontaneous breathing, nonlabored ventilation and respiratory function stable Cardiovascular status: blood pressure returned to baseline and stable Postop Assessment: no signs of nausea or vomiting Anesthetic complications: no    Last Vitals:  Filed Vitals:   10/08/15 1045 10/08/15 1059  BP: 135/68 126/69  Pulse: 80 80  Temp: 36.7 C 36.8 C  Resp: 15 16    Last Pain:  Filed Vitals:   10/08/15 1100  PainSc: 4                  Reino KentJudd, Shabnam Ladd J

## 2015-10-08 NOTE — Transfer of Care (Signed)
Immediate Anesthesia Transfer of Care Note  Patient: Leslie Mitchell  Procedure(s) Performed: Procedure(s): LAPAROSCOPIC CHOLECYSTECTOMY SINGLE SITE WITH INTRAOPERATIVE CHOLANGIOGRAM (N/A)  Patient Location: PACU  Anesthesia Type:General  Level of Consciousness: alert , oriented and patient cooperative  Airway & Oxygen Therapy: Patient connected to face mask oxygen  Post-op Assessment: Post -op Vital signs reviewed and stable  Post vital signs: stable  Last Vitals:  Filed Vitals:   10/08/15 0508  BP: 117/72  Pulse: 90  Temp: 36.7 C  Resp: 18    Complications: No apparent anesthesia complications

## 2015-10-09 ENCOUNTER — Encounter (HOSPITAL_COMMUNITY): Payer: Self-pay | Admitting: Surgery

## 2017-03-13 IMAGING — US US ABDOMEN LIMITED
1 series · 14 of 25 positions shown · non-contrast
Comparison: None.

CLINICAL DATA: Right upper quadrant pain since [REDACTED].

EXAM:
US ABDOMEN LIMITED - RIGHT UPPER QUADRANT

[Series 1: us abdomen limited · 0.27mm/px · 14 of 41 slices shown]
[im 1/41]
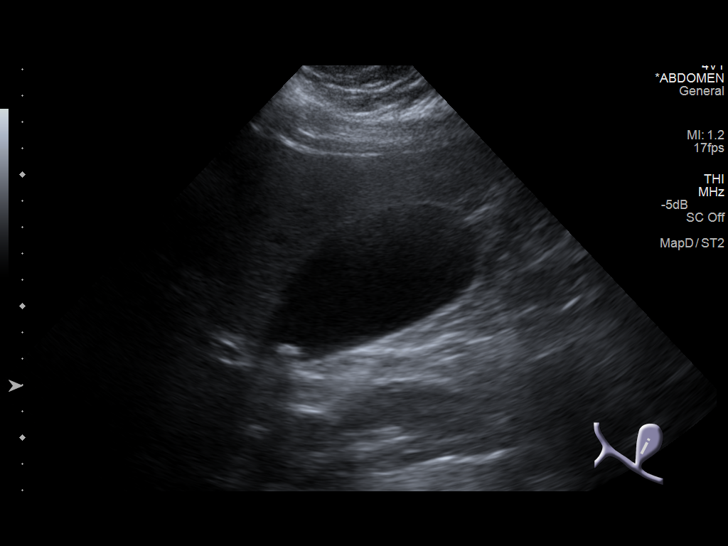
[im 4/41]
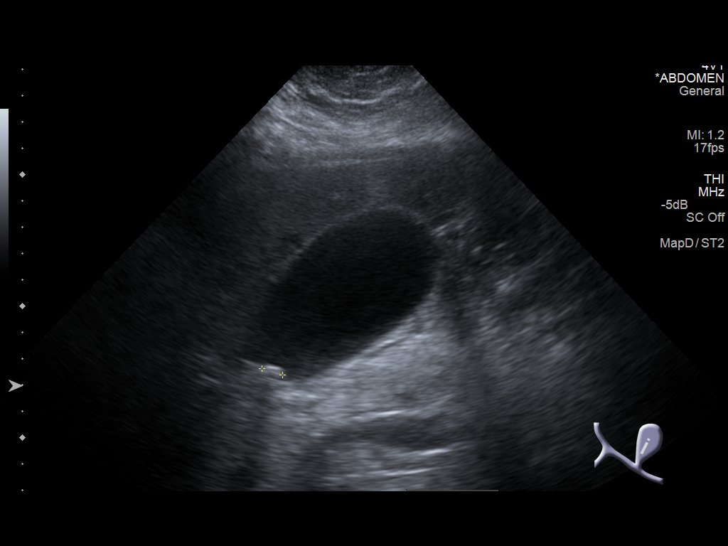
[im 7/41]
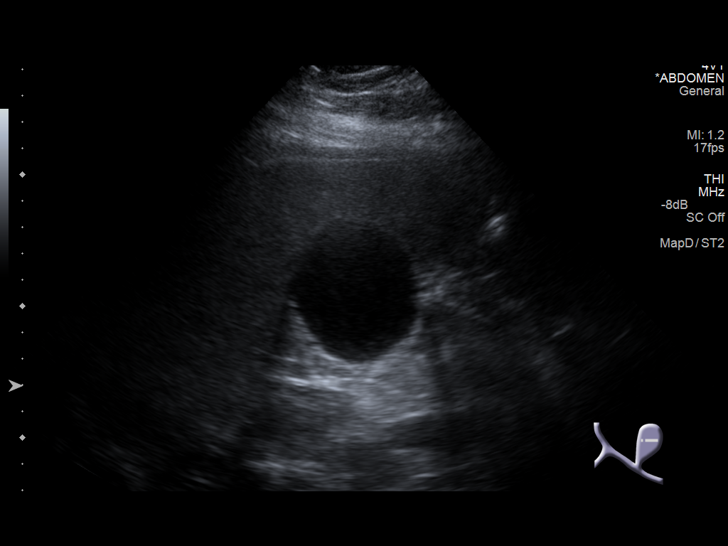
[im 11/41]
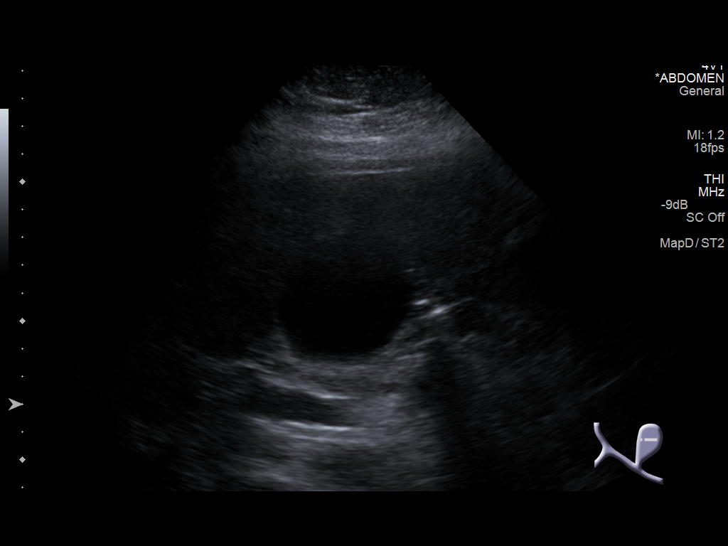
[im 14/41]
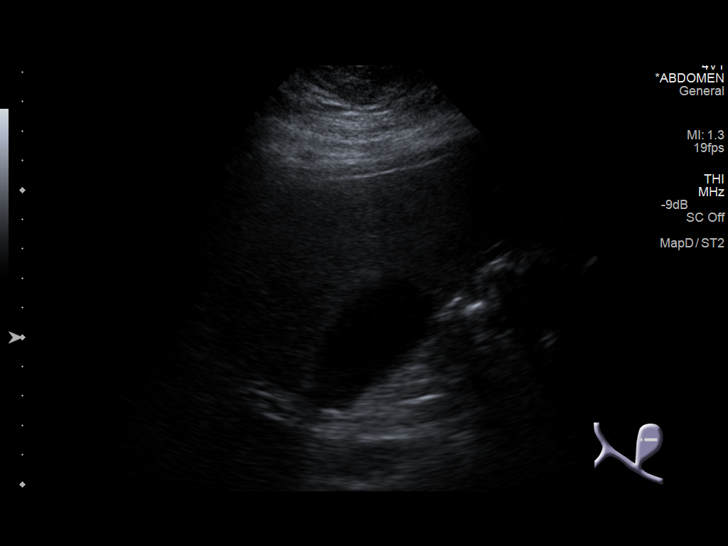
[im 16/41]
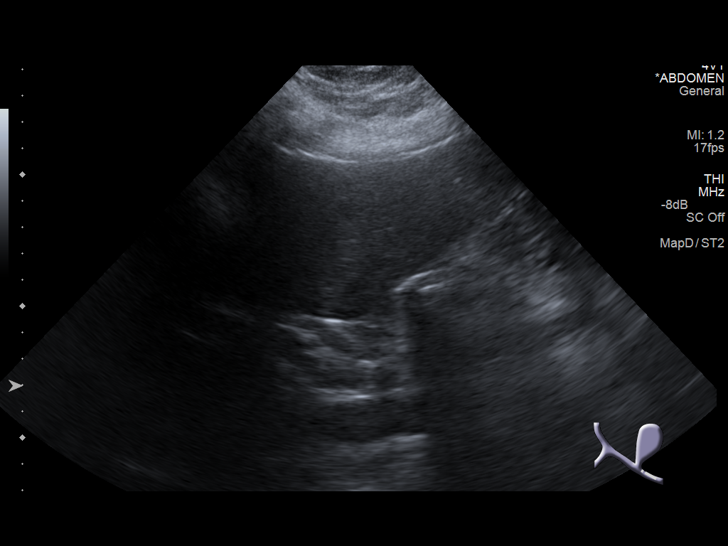
[im 19/41]
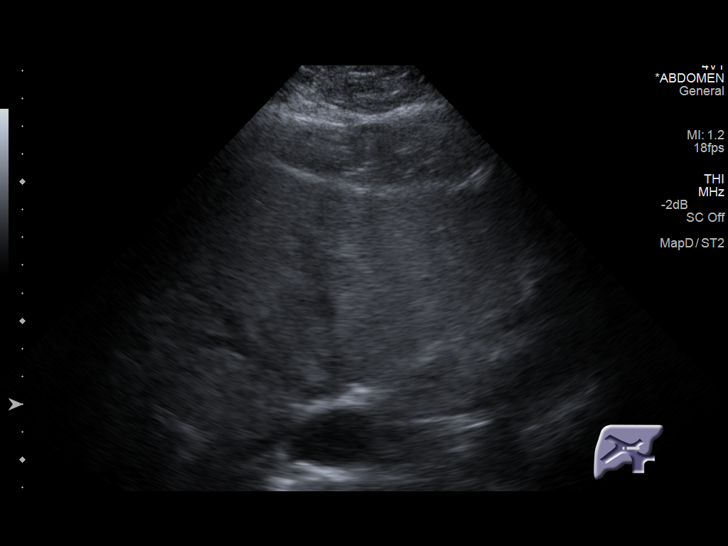
[im 22/41]
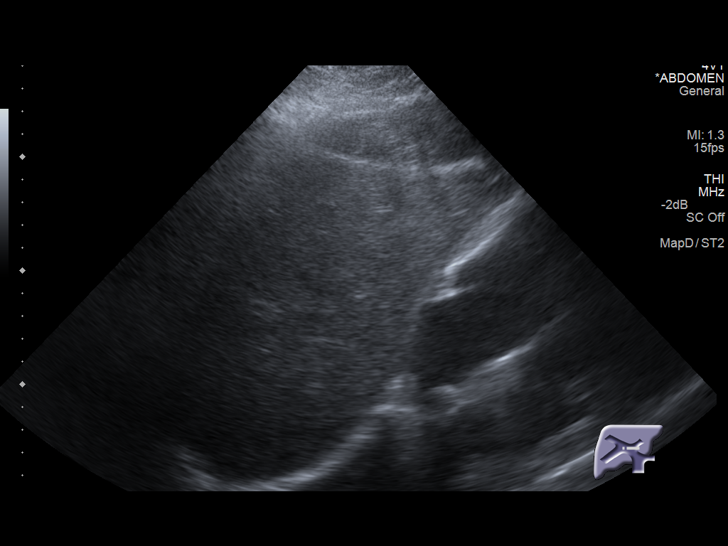
[im 26/41]
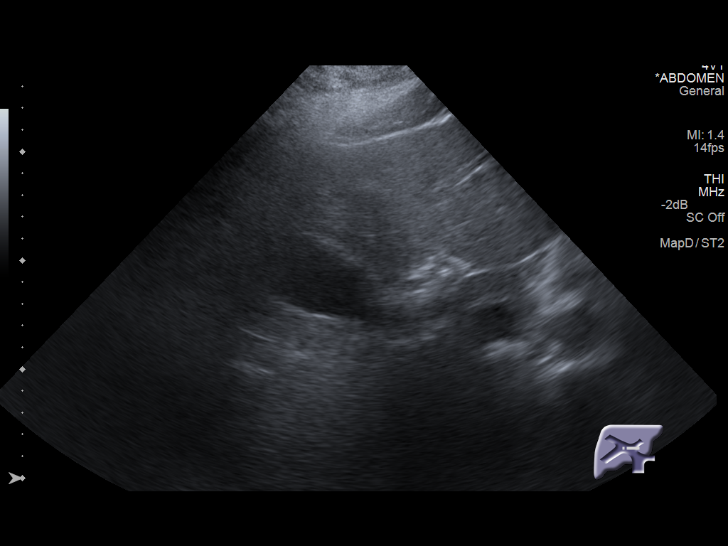
[im 27/41]
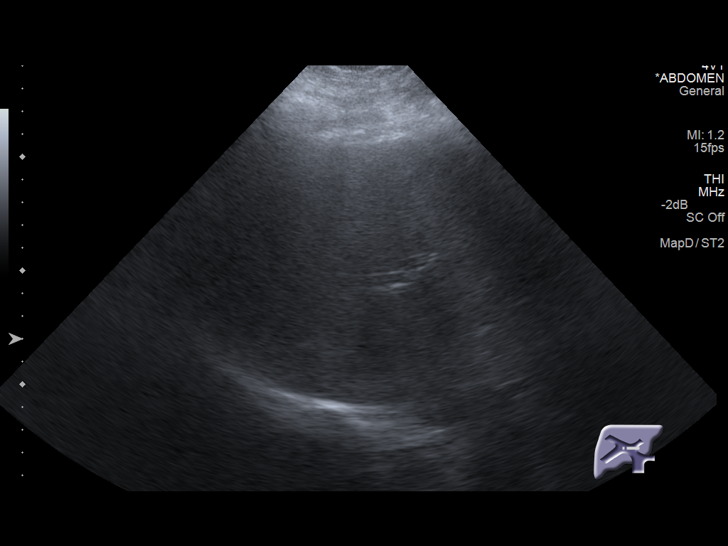
[im 31/41]
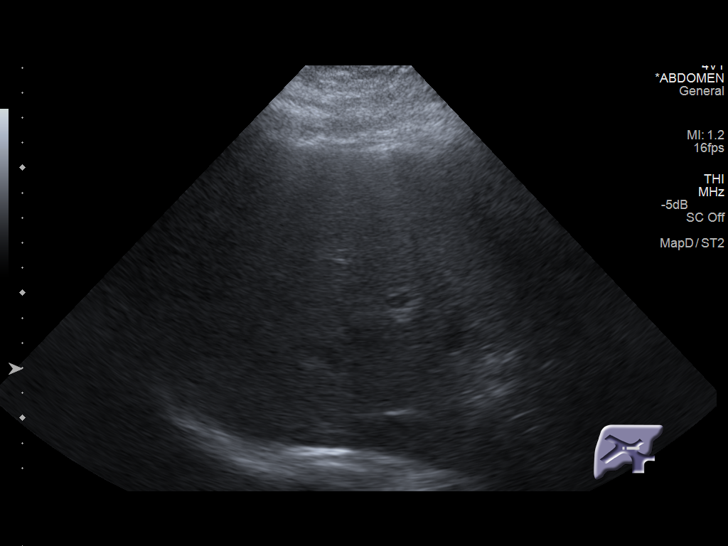
[im 34/41]
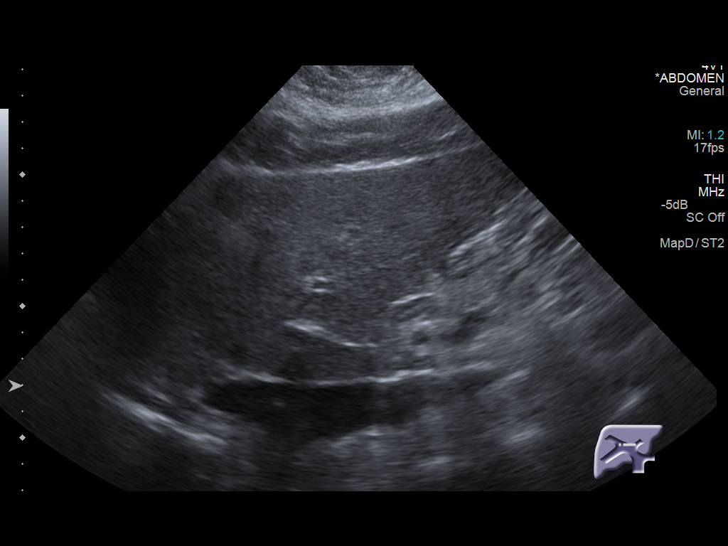
[im 37/41]
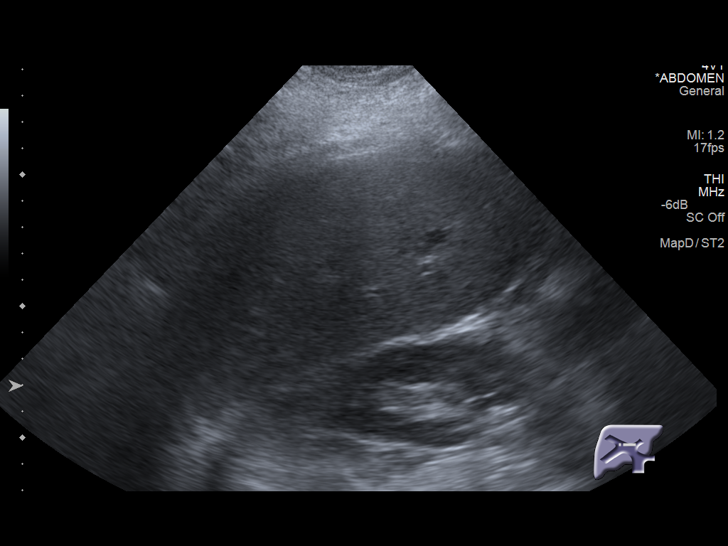
[im 41/41]
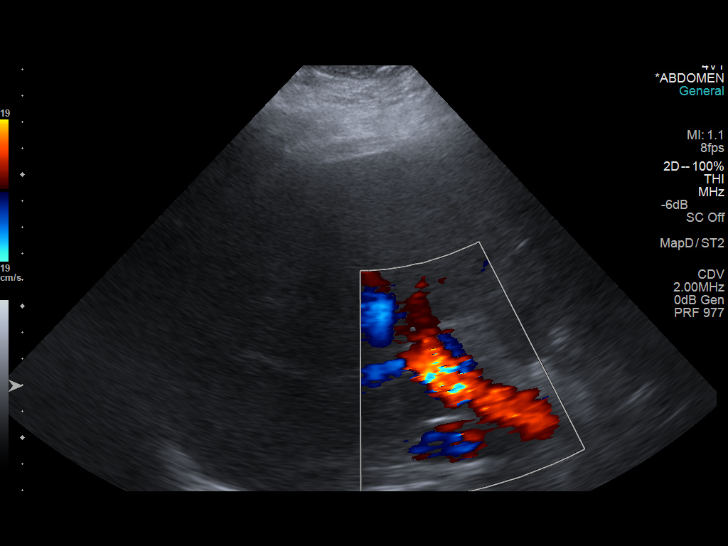

[14 of 25 positions shown; findings below may reference images not displayed]

FINDINGS: Gallbladder:

Stone in the dependent gallbladder with mild sludge layering. Stone
measures about 8 mm maximal diameter. No gallbladder wall
thickening. Murphy's sign is negative.

Common bile duct:

Diameter: 5.3 mm, normal

Liver:

No focal lesion identified. Within normal limits in parenchymal
echogenicity.
IMPRESSION: Cholelithiasis with a single stone and mild sludge layering. No
secondary signs to suggest cholecystitis.

## 2017-11-09 IMAGING — RF DG CHOLANGIOGRAM OPERATIVE
1 series · 4 of 4 positions shown · non-contrast
Comparison: 09/10/2015

CLINICAL DATA: Cholelithiasis, right upper quadrant

EXAM:
INTRAOPERATIVE CHOLANGIOGRAM
TECHNIQUE: Cholangiographic images from the C-arm fluoroscopic device were
submitted for interpretation post-operatively. Please see the
procedural report for the amount of contrast and the fluoroscopy
time utilized.

[Series 1: run · 4 of 37 frames shown]
[frame 6/37]
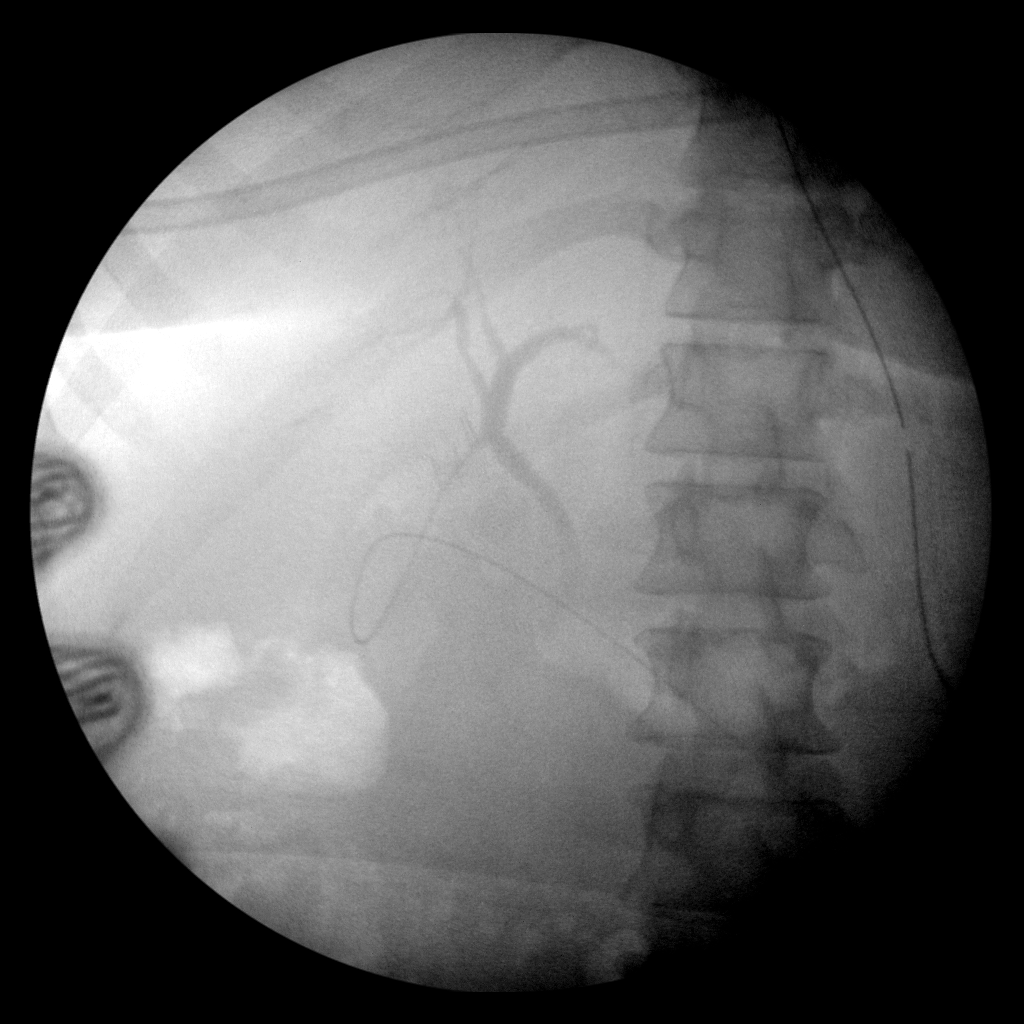
[frame 19/37]
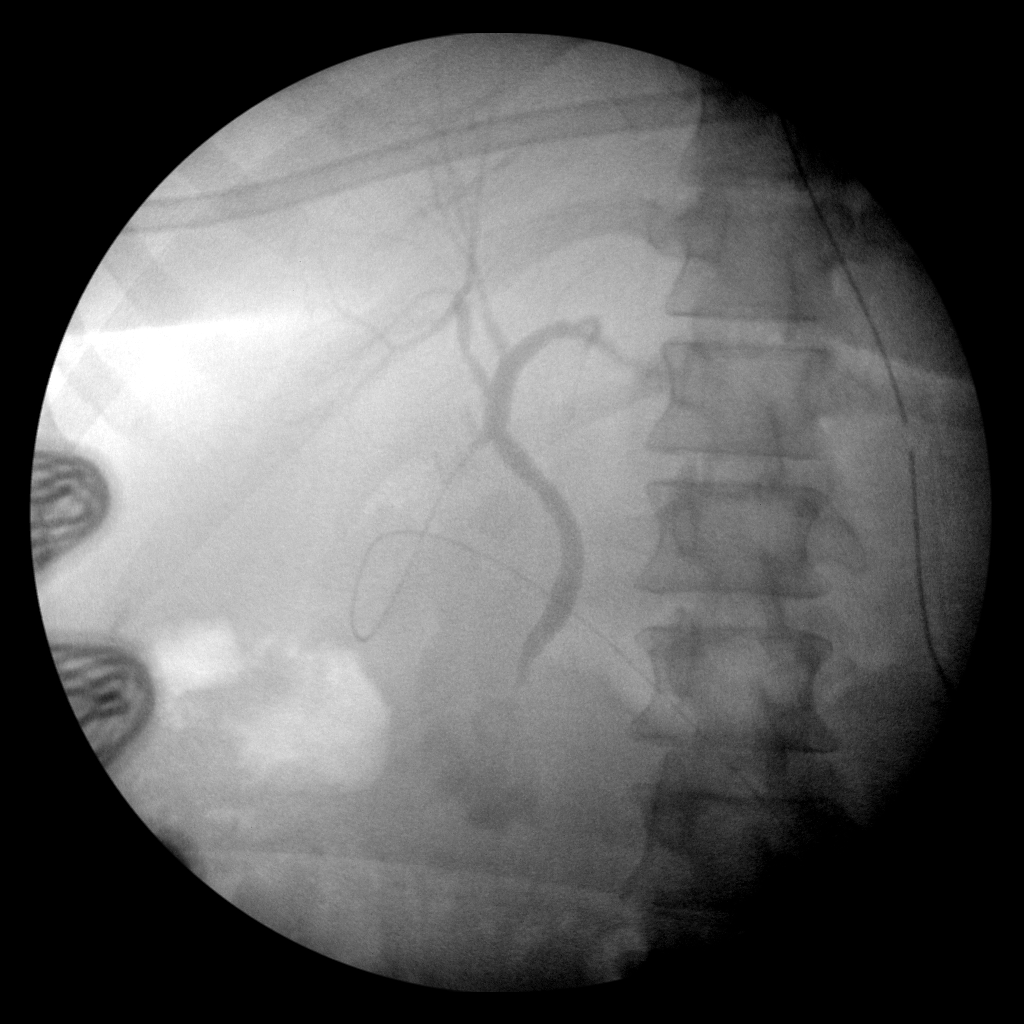
[frame 29/37]
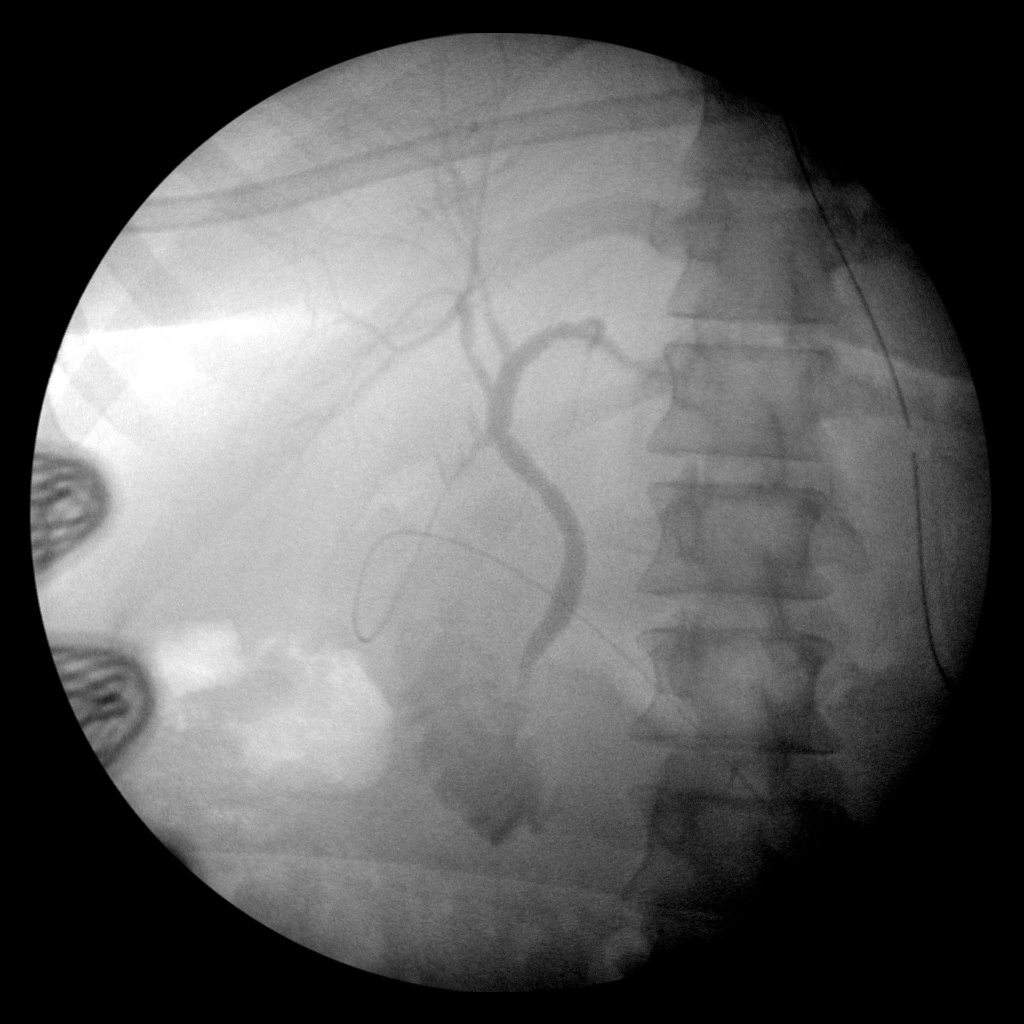
[frame 32/37]
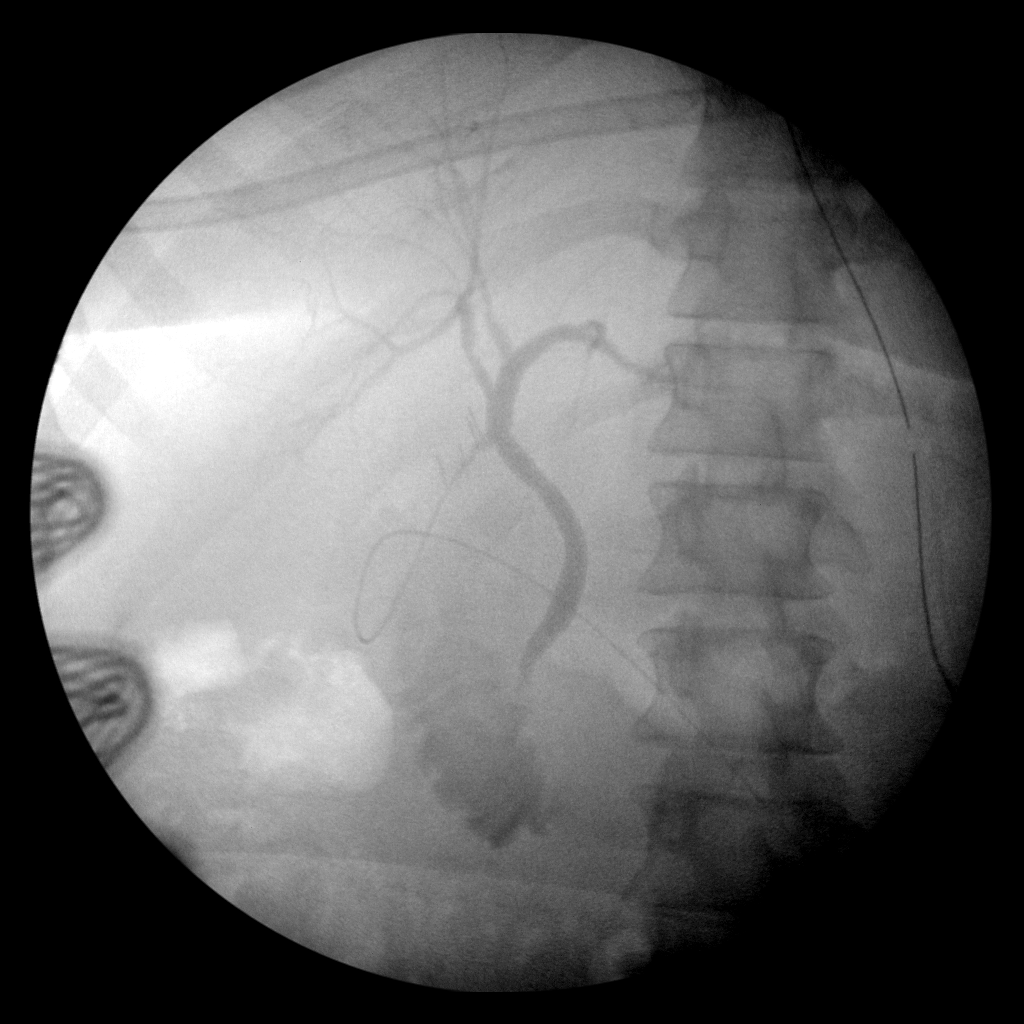

[4 of 4 positions shown; findings below may reference images not displayed]

FINDINGS: Intraoperative cholangiogram performed during laparoscopic
cholecystectomy. The biliary confluence, common hepatic duct, and
common bile duct are patent. No dilatation or obstruction. No
filling defect. Contrast drains into the duodenum.
IMPRESSION: Patent biliary system.
